# Patient Record
Sex: Female | Born: 1945
Health system: Southern US, Community
[De-identification: ages and names within clinical notes are randomized; demographics above are authoritative.]

## PROBLEM LIST (undated history)

## (undated) DIAGNOSIS — J45909 Unspecified asthma, uncomplicated: Secondary | ICD-10-CM

## (undated) DIAGNOSIS — K219 Gastro-esophageal reflux disease without esophagitis: Secondary | ICD-10-CM

## (undated) DIAGNOSIS — Z87442 Personal history of urinary calculi: Secondary | ICD-10-CM

## (undated) DIAGNOSIS — H3530 Unspecified macular degeneration: Secondary | ICD-10-CM

## (undated) DIAGNOSIS — M8580 Other specified disorders of bone density and structure, unspecified site: Secondary | ICD-10-CM

## (undated) DIAGNOSIS — E785 Hyperlipidemia, unspecified: Secondary | ICD-10-CM

## (undated) DIAGNOSIS — G4730 Sleep apnea, unspecified: Secondary | ICD-10-CM

## (undated) DIAGNOSIS — F419 Anxiety disorder, unspecified: Secondary | ICD-10-CM

## (undated) DIAGNOSIS — E079 Disorder of thyroid, unspecified: Secondary | ICD-10-CM

## (undated) DIAGNOSIS — I10 Essential (primary) hypertension: Secondary | ICD-10-CM

## (undated) HISTORY — DX: Other specified disorders of bone density and structure, unspecified site: M85.80

## (undated) HISTORY — PX: FOREARM SURGERY: SHX651

## (undated) HISTORY — DX: Gastro-esophageal reflux disease without esophagitis: K21.9

## (undated) HISTORY — DX: Essential (primary) hypertension: I10

## (undated) HISTORY — DX: Disorder of thyroid, unspecified: E07.9

## (undated) HISTORY — PX: APPENDECTOMY: SHX54

## (undated) HISTORY — DX: Hyperlipidemia, unspecified: E78.5

## (undated) HISTORY — DX: Anxiety disorder, unspecified: F41.9

## (undated) HISTORY — DX: Unspecified macular degeneration: H35.30

---

## 1998-03-05 ENCOUNTER — Emergency Department (HOSPITAL_COMMUNITY): Admission: EM | Admit: 1998-03-05 | Discharge: 1998-03-05 | Payer: Self-pay | Admitting: Emergency Medicine

## 1999-07-09 ENCOUNTER — Other Ambulatory Visit: Admission: RE | Admit: 1999-07-09 | Discharge: 1999-07-09 | Payer: Self-pay | Admitting: Gynecology

## 2000-05-14 ENCOUNTER — Encounter: Payer: Self-pay | Admitting: Internal Medicine

## 2000-05-14 ENCOUNTER — Encounter: Admission: RE | Admit: 2000-05-14 | Discharge: 2000-05-14 | Payer: Self-pay | Admitting: Family Medicine

## 2001-05-30 ENCOUNTER — Encounter: Admission: RE | Admit: 2001-05-30 | Discharge: 2001-05-30 | Payer: Self-pay | Admitting: Geriatric Medicine

## 2001-05-30 ENCOUNTER — Encounter: Payer: Self-pay | Admitting: Geriatric Medicine

## 2002-01-22 ENCOUNTER — Other Ambulatory Visit: Admission: RE | Admit: 2002-01-22 | Discharge: 2002-01-22 | Payer: Self-pay | Admitting: Gynecology

## 2003-01-12 ENCOUNTER — Inpatient Hospital Stay (HOSPITAL_COMMUNITY): Admission: EM | Admit: 2003-01-12 | Discharge: 2003-01-13 | Payer: Self-pay | Admitting: Emergency Medicine

## 2003-01-12 ENCOUNTER — Encounter: Payer: Self-pay | Admitting: Emergency Medicine

## 2003-06-03 ENCOUNTER — Other Ambulatory Visit: Admission: RE | Admit: 2003-06-03 | Discharge: 2003-06-03 | Payer: Self-pay | Admitting: Gynecology

## 2004-02-14 ENCOUNTER — Encounter: Admission: RE | Admit: 2004-02-14 | Discharge: 2004-02-14 | Payer: Self-pay | Admitting: Internal Medicine

## 2004-03-02 ENCOUNTER — Encounter: Admission: RE | Admit: 2004-03-02 | Discharge: 2004-05-31 | Payer: Self-pay | Admitting: Internal Medicine

## 2004-08-31 ENCOUNTER — Ambulatory Visit: Payer: Self-pay | Admitting: Internal Medicine

## 2004-09-11 ENCOUNTER — Ambulatory Visit: Payer: Self-pay | Admitting: Endocrinology

## 2004-09-15 ENCOUNTER — Ambulatory Visit: Payer: Self-pay | Admitting: Internal Medicine

## 2004-10-01 ENCOUNTER — Ambulatory Visit: Payer: Self-pay | Admitting: Internal Medicine

## 2004-10-22 ENCOUNTER — Other Ambulatory Visit: Admission: RE | Admit: 2004-10-22 | Discharge: 2004-10-22 | Payer: Self-pay | Admitting: Gynecology

## 2004-10-28 ENCOUNTER — Ambulatory Visit: Payer: Self-pay | Admitting: Internal Medicine

## 2005-05-28 ENCOUNTER — Ambulatory Visit: Payer: Self-pay | Admitting: Internal Medicine

## 2005-08-03 ENCOUNTER — Ambulatory Visit: Payer: Self-pay | Admitting: Internal Medicine

## 2005-08-09 ENCOUNTER — Ambulatory Visit: Payer: Self-pay | Admitting: Internal Medicine

## 2006-02-01 ENCOUNTER — Ambulatory Visit: Payer: Self-pay | Admitting: Internal Medicine

## 2006-05-10 ENCOUNTER — Ambulatory Visit: Payer: Self-pay | Admitting: Internal Medicine

## 2006-08-25 ENCOUNTER — Ambulatory Visit: Payer: Self-pay | Admitting: Internal Medicine

## 2006-09-06 ENCOUNTER — Ambulatory Visit: Payer: Self-pay | Admitting: Family Medicine

## 2007-01-10 ENCOUNTER — Ambulatory Visit: Payer: Self-pay | Admitting: Internal Medicine

## 2007-01-10 LAB — CONVERTED CEMR LAB
AST: 20 units/L (ref 0–37)
Albumin: 3.8 g/dL (ref 3.5–5.2)
Alkaline Phosphatase: 67 units/L (ref 39–117)
BUN: 11 mg/dL (ref 6–23)
Basophils Absolute: 0.1 10*3/uL (ref 0.0–0.1)
Bilirubin Urine: NEGATIVE
Chloride: 104 meq/L (ref 96–112)
Cholesterol: 210 mg/dL (ref 0–200)
Creatinine, Ser: 0.8 mg/dL (ref 0.4–1.2)
Direct LDL: 116.6 mg/dL
Eosinophils Absolute: 0.4 10*3/uL (ref 0.0–0.6)
GFR calc non Af Amer: 78 mL/min
HDL: 65.7 mg/dL (ref >39.0)
Hemoglobin, Urine: NEGATIVE
Ketones, ur: NEGATIVE mg/dL
MCHC: 33.6 g/dL (ref 30.0–36.0)
MCV: 82.7 fL (ref 78.0–100.0)
Monocytes Relative: 7.3 % (ref 3.0–11.0)
Potassium: 4.3 meq/L (ref 3.5–5.1)
RBC: 4.5 M/uL (ref 3.87–5.11)
RDW: 13.6 % (ref 11.5–14.6)
Sodium: 141 meq/L (ref 135–145)
Total Bilirubin: 0.7 mg/dL (ref 0.3–1.2)
Total CHOL/HDL Ratio: 3.2
Triglycerides: 155 mg/dL — ABNORMAL HIGH (ref 0–149)
Urine Glucose: NEGATIVE mg/dL
VLDL: 31 mg/dL (ref 0–40)
Vitamin B-12: 346 pg/mL (ref 211–911)
pH: 7 (ref 5.0–8.0)

## 2007-01-17 ENCOUNTER — Ambulatory Visit: Payer: Self-pay | Admitting: Internal Medicine

## 2007-05-19 DIAGNOSIS — E039 Hypothyroidism, unspecified: Secondary | ICD-10-CM | POA: Insufficient documentation

## 2007-05-19 DIAGNOSIS — E049 Nontoxic goiter, unspecified: Secondary | ICD-10-CM | POA: Insufficient documentation

## 2007-05-19 DIAGNOSIS — H409 Unspecified glaucoma: Secondary | ICD-10-CM | POA: Insufficient documentation

## 2007-05-19 DIAGNOSIS — G2581 Restless legs syndrome: Secondary | ICD-10-CM | POA: Insufficient documentation

## 2007-05-19 DIAGNOSIS — K219 Gastro-esophageal reflux disease without esophagitis: Secondary | ICD-10-CM | POA: Insufficient documentation

## 2007-05-19 DIAGNOSIS — J309 Allergic rhinitis, unspecified: Secondary | ICD-10-CM | POA: Insufficient documentation

## 2007-05-19 DIAGNOSIS — E538 Deficiency of other specified B group vitamins: Secondary | ICD-10-CM | POA: Insufficient documentation

## 2007-05-19 DIAGNOSIS — I10 Essential (primary) hypertension: Secondary | ICD-10-CM | POA: Insufficient documentation

## 2007-09-15 ENCOUNTER — Ambulatory Visit: Payer: Self-pay | Admitting: Internal Medicine

## 2007-09-15 DIAGNOSIS — K9430 Esophagostomy complications, unspecified: Secondary | ICD-10-CM

## 2007-09-15 DIAGNOSIS — K9433 Esophagostomy malfunction: Secondary | ICD-10-CM | POA: Insufficient documentation

## 2007-09-15 LAB — CONVERTED CEMR LAB
Alkaline Phosphatase: 69 units/L (ref 39–117)
BUN: 15 mg/dL (ref 6–23)
Basophils Absolute: 0.1 10*3/uL (ref 0.0–0.1)
Bilirubin, Direct: 0.1 mg/dL (ref 0.0–0.3)
CO2: 28 meq/L (ref 19–32)
Cholesterol: 231 mg/dL (ref 0–200)
Direct LDL: 129.1 mg/dL
Eosinophils Absolute: 0.4 10*3/uL (ref 0.0–0.6)
GFR calc Af Amer: 94 mL/min
GFR calc non Af Amer: 78 mL/min
HDL: 79.4 mg/dL (ref >39.0)
Ketones, ur: NEGATIVE mg/dL
Lymphocytes Relative: 26.9 % (ref 12.0–46.0)
MCHC: 33.5 g/dL (ref 30.0–36.0)
MCV: 82.9 fL (ref 78.0–100.0)
Monocytes Relative: 6.7 % (ref 3.0–11.0)
Mucus, UA: NEGATIVE
Neutro Abs: 3.5 10*3/uL (ref 1.4–7.7)
Platelets: 272 10*3/uL (ref 150–400)
Potassium: 3.9 meq/L (ref 3.5–5.1)
RBC: 4.56 M/uL (ref 3.87–5.11)
TSH: 5.59 microintl units/mL — ABNORMAL HIGH (ref 0.35–5.50)
Total Protein: 7.5 g/dL (ref 6.0–8.3)
Triglycerides: 107 mg/dL (ref 0–149)
Urine Glucose: NEGATIVE mg/dL
Urobilinogen, UA: 0.2 (ref 0.0–1.0)
Vit D, 1,25-Dihydroxy: 41 (ref 30–89)
WBC: 6 10*3/uL (ref 4.5–10.5)
pH: 6 (ref 5.0–8.0)

## 2007-09-19 ENCOUNTER — Ambulatory Visit: Payer: Self-pay | Admitting: Internal Medicine

## 2007-09-19 DIAGNOSIS — N3090 Cystitis, unspecified without hematuria: Secondary | ICD-10-CM | POA: Insufficient documentation

## 2007-10-03 ENCOUNTER — Ambulatory Visit: Payer: Self-pay | Admitting: Internal Medicine

## 2007-10-03 ENCOUNTER — Observation Stay (HOSPITAL_COMMUNITY): Admission: EM | Admit: 2007-10-03 | Discharge: 2007-10-04 | Payer: Self-pay | Admitting: Emergency Medicine

## 2007-10-20 ENCOUNTER — Telehealth: Payer: Self-pay | Admitting: Internal Medicine

## 2007-10-26 ENCOUNTER — Encounter: Payer: Self-pay | Admitting: Internal Medicine

## 2007-10-26 ENCOUNTER — Ambulatory Visit: Payer: Self-pay

## 2007-11-08 ENCOUNTER — Ambulatory Visit: Payer: Self-pay | Admitting: Internal Medicine

## 2007-11-08 DIAGNOSIS — R05 Cough: Secondary | ICD-10-CM

## 2007-11-08 DIAGNOSIS — R059 Cough, unspecified: Secondary | ICD-10-CM | POA: Insufficient documentation

## 2007-11-12 DIAGNOSIS — F411 Generalized anxiety disorder: Secondary | ICD-10-CM | POA: Insufficient documentation

## 2007-12-13 ENCOUNTER — Ambulatory Visit: Payer: Self-pay | Admitting: Internal Medicine

## 2007-12-20 LAB — CONVERTED CEMR LAB
ALT: 19 units/L (ref 0–35)
AST: 20 units/L (ref 0–37)
Albumin: 4.1 g/dL (ref 3.5–5.2)
Alkaline Phosphatase: 69 units/L (ref 39–117)
Crystals: NEGATIVE
HDL: 63.3 mg/dL (ref >39.0)
RBC / HPF: NONE SEEN
Specific Gravity, Urine: >=1.03 (ref 1.000–1.03)
Vitamin B-12: 840 pg/mL (ref 211–911)

## 2008-01-10 ENCOUNTER — Ambulatory Visit: Payer: Self-pay | Admitting: Internal Medicine

## 2008-01-10 DIAGNOSIS — J069 Acute upper respiratory infection, unspecified: Secondary | ICD-10-CM | POA: Insufficient documentation

## 2008-01-10 DIAGNOSIS — R5383 Other fatigue: Secondary | ICD-10-CM

## 2008-01-10 DIAGNOSIS — R5381 Other malaise: Secondary | ICD-10-CM | POA: Insufficient documentation

## 2008-02-16 ENCOUNTER — Ambulatory Visit: Payer: Self-pay | Admitting: Internal Medicine

## 2008-02-16 DIAGNOSIS — M25569 Pain in unspecified knee: Secondary | ICD-10-CM | POA: Insufficient documentation

## 2008-02-17 DIAGNOSIS — M1990 Unspecified osteoarthritis, unspecified site: Secondary | ICD-10-CM | POA: Insufficient documentation

## 2008-03-06 ENCOUNTER — Ambulatory Visit: Payer: Self-pay | Admitting: Internal Medicine

## 2008-03-06 ENCOUNTER — Observation Stay (HOSPITAL_COMMUNITY): Admission: EM | Admit: 2008-03-06 | Discharge: 2008-03-07 | Payer: Self-pay | Admitting: Emergency Medicine

## 2008-03-15 ENCOUNTER — Ambulatory Visit: Payer: Self-pay | Admitting: Internal Medicine

## 2008-03-15 DIAGNOSIS — R079 Chest pain, unspecified: Secondary | ICD-10-CM | POA: Insufficient documentation

## 2008-03-22 ENCOUNTER — Encounter: Admission: RE | Admit: 2008-03-22 | Discharge: 2008-03-22 | Payer: Self-pay | Admitting: Internal Medicine

## 2008-03-29 ENCOUNTER — Telehealth: Payer: Self-pay | Admitting: Internal Medicine

## 2009-02-21 ENCOUNTER — Ambulatory Visit: Payer: Self-pay | Admitting: Internal Medicine

## 2009-04-03 ENCOUNTER — Ambulatory Visit: Payer: Self-pay | Admitting: Internal Medicine

## 2009-05-05 ENCOUNTER — Encounter: Admission: RE | Admit: 2009-05-05 | Discharge: 2009-05-05 | Payer: Self-pay | Admitting: Internal Medicine

## 2009-10-23 ENCOUNTER — Ambulatory Visit: Payer: Self-pay | Admitting: Internal Medicine

## 2009-12-03 ENCOUNTER — Encounter (INDEPENDENT_AMBULATORY_CARE_PROVIDER_SITE_OTHER): Payer: Self-pay | Admitting: *Deleted

## 2010-01-06 ENCOUNTER — Encounter (INDEPENDENT_AMBULATORY_CARE_PROVIDER_SITE_OTHER): Payer: Self-pay | Admitting: *Deleted

## 2010-01-07 ENCOUNTER — Ambulatory Visit: Payer: Self-pay | Admitting: Internal Medicine

## 2010-01-16 ENCOUNTER — Telehealth: Payer: Self-pay | Admitting: Internal Medicine

## 2010-02-02 ENCOUNTER — Encounter (INDEPENDENT_AMBULATORY_CARE_PROVIDER_SITE_OTHER): Payer: Self-pay | Admitting: *Deleted

## 2010-03-05 ENCOUNTER — Encounter (INDEPENDENT_AMBULATORY_CARE_PROVIDER_SITE_OTHER): Payer: Self-pay | Admitting: *Deleted

## 2010-03-09 ENCOUNTER — Ambulatory Visit: Payer: Self-pay | Admitting: Internal Medicine

## 2010-03-17 ENCOUNTER — Ambulatory Visit: Payer: Self-pay | Admitting: Internal Medicine

## 2010-03-18 ENCOUNTER — Encounter: Payer: Self-pay | Admitting: Internal Medicine

## 2010-03-24 ENCOUNTER — Ambulatory Visit: Payer: Self-pay | Admitting: Internal Medicine

## 2010-09-03 ENCOUNTER — Ambulatory Visit: Payer: Self-pay | Admitting: Internal Medicine

## 2010-09-18 ENCOUNTER — Ambulatory Visit: Payer: Self-pay | Admitting: Internal Medicine

## 2010-09-25 ENCOUNTER — Ambulatory Visit: Payer: Self-pay | Admitting: Internal Medicine

## 2010-10-02 ENCOUNTER — Ambulatory Visit: Payer: Self-pay | Admitting: Internal Medicine

## 2010-11-13 ENCOUNTER — Ambulatory Visit
Admission: RE | Admit: 2010-11-13 | Discharge: 2010-11-13 | Payer: Self-pay | Source: Home / Self Care | Attending: Internal Medicine | Admitting: Internal Medicine

## 2010-11-19 NOTE — Letter (Signed)
Summary: Patient Notice- Polyp Results   Gastroenterology  [address] [place], [place] [address]   Phone: [phone]  Fax: [phone]        March 18, 2010 MRN: [id number]    [facility] [address] [place], [place]  [address]    Dear Ms. Andrey Campanile,  I am pleased to inform you that the colon polyp(s) removed during your recent colonoscopy was (were) found to be benign (no cancer detected) upon pathologic examination.  I recommend you have a repeat colonoscopy examination in 10 years to look for recurrent polyps, as having colon polyps increases your risk for having recurrent polyps or even colon cancer in the future.  Should you develop new or worsening symptoms of abdominal pain, bowel habit changes or bleeding from the rectum or bowels, please schedule an evaluation with either your primary care physician or with me.  Additional information/recommendations:  __ No further action with gastroenterology is needed at this time. Please      follow-up with your primary care physician for your other healthcare      needs.   Please call us if you are having persistent problems or have questions about your condition that have not been fully answered at this time.  Sincerely,  Hilarie Fredrickson MD  This letter has been electronically signed by your physician.  Appended Document: Patient Notice- Polyp Results letter mailed.

## 2010-11-19 NOTE — Letter (Signed)
Summary: Previsit letter  [facility]  [address] [place], [place] [address]   Phone: [phone]  Fax: [phone]       12/03/2009 MRN: [id number]  [facility] [address] [place], [place]  [address]  Dear Ms. Andrey Campanile,  Welcome to the Gastroenterology Division at [facility].    You are scheduled to see a nurse for your pre-procedure visit on 01-07-10 at 10:30AM on the 3rd floor at [facility], [address]  We ask that you try to arrive at our office 15 minutes prior to your appointment time to allow for check-in.  Your nurse visit will consist of discussing your medical and surgical history, your immediate family medical history, and your medications.    Please bring a complete list of all your medications or, if you prefer, bring the medication bottles and we will list them.  We will need to be aware of both prescribed and over the counter drugs.  We will need to know exact dosage information as well.  If you are on blood thinners (Coumadin, Plavix, Aggrenox, Ticlid, etc.) please call our office today/prior to your appointment, as we need to consult with your physician about holding your medication.   Please be prepared to read and sign documents such as consent forms, a financial agreement, and acknowledgement forms.  If necessary, and with your consent, a friend or relative is welcome to sit-in on the nurse visit with you.  Please bring your insurance card so that we may make a copy of it.  If your insurance requires a referral to see a specialist, please bring your referral form from your primary care physician.  No co-pay is required for this nurse visit.     If you cannot keep your appointment, please call [phone] to cancel or reschedule prior to your appointment date.  This allows [place] the opportunity to schedule an appointment for another patient in need of care.    Thank you for choosing [facility] for your medical  needs.  We appreciate the opportunity to care for you.  Please visit [place] at our website  to learn more about our practice.                     Sincerely.                                                                                                                   The Gastroenterology Division

## 2010-11-19 NOTE — Letter (Signed)
Summary: Moviprep Instructions  [facility]  [address]   [place], [place] [address]   Phone: [phone]  Fax: [phone]       [name]    1946-04-13    MRN: [id number]        Procedure Day /Date: Tuesday, 03-17-10     Arrival Time: 10:00 a.m.      Procedure Time: 11:00 a.m.     Location of Procedure:                    x   [facility] (4th Floor)                        PREPARATION FOR COLONOSCOPY WITH MOVIPREP   Starting 5 days prior to your procedure 03-12-10  do not eat nuts, seeds, popcorn, corn, beans, peas,  salads, or any raw vegetables.  Do not take any fiber supplements (e.g. Metamucil, Citrucel, and Benefiber).  THE DAY BEFORE YOUR PROCEDURE         DATE: 03-16-10   DAY: Monday  1.  Drink clear liquids the entire day-NO SOLID FOOD  2.  Do not drink anything colored red or purple.  Avoid juices with pulp.  No orange juice.  3.  Drink at least 64 oz. (8 glasses) of fluid/clear liquids during the day to prevent dehydration and help the prep work efficiently.  CLEAR LIQUIDS INCLUDE: Water Jello Ice Popsicles Tea (sugar ok, no milk/cream) Powdered fruit flavored drinks Coffee (sugar ok, no milk/cream) Gatorade Juice: apple, white grape, white cranberry  Lemonade Clear bullion, consomm, broth Carbonated beverages (any kind) Strained chicken noodle soup Hard Candy                             4.  In the morning, mix first dose of MoviPrep solution:    Empty 1 Pouch A and 1 Pouch B into the disposable container    Add lukewarm drinking water to the top line of the container. Mix to dissolve    Refrigerate (mixed solution should be used within 24 hrs)  5.  Begin drinking the prep at 5:00 p.m. The MoviPrep container is divided by 4 marks.   Every 15 minutes drink the solution down to the next mark (approximately 8 oz) until the full liter is complete.   6.  Follow completed prep with 16 oz of clear liquid of your choice  (Nothing red or purple).  Continue to drink clear liquids until bedtime.  7.  Before going to bed, mix second dose of MoviPrep solution:    Empty 1 Pouch A and 1 Pouch B into the disposable container    Add lukewarm drinking water to the top line of the container. Mix to dissolve    Refrigerate  THE DAY OF YOUR PROCEDURE      DATE: 03-17-10   DAY: Tuesday  Beginning at 6:00 a.m. (5 hours before procedure):         1. Every 15 minutes, drink the solution down to the next mark (approx 8 oz) until the full liter is complete.  2. Follow completed prep with 16 oz. of clear liquid of your choice.    3. You may drink clear liquids until 9:00 a.m.  (2 HOURS BEFORE PROCEDURE).   MEDICATION INSTRUCTIONS  Unless otherwise instructed, you should take regular prescription medications with a small sip of water   as  early as possible the morning of your procedure.   Additional medication instructions:  n/a         OTHER INSTRUCTIONS  You will need a responsible adult at least 64 years of age to accompany you and drive you home.   This person must remain in the waiting room during your procedure.  Wear loose fitting clothing that is easily removed.  Leave jewelry and other valuables at home.  However, you may wish to bring a book to read or  an iPod/MP3 player to listen to music as you wait for your procedure to start.  Remove all body piercing jewelry and leave at home.  Total time from sign-in until discharge is approximately 2-3 hours.  You should go home directly after your procedure and rest.  You can resume normal activities the  day after your procedure.  The day of your procedure you should not:   Drive   Make legal decisions   Operate machinery   Drink alcohol   Return to work  You will receive specific instructions about eating, activities and medications before you leave.    The above instructions have been reviewed and explained to me by   Sherren Kerns RN   Mar 09, 2010 10:15 AM    I fully understand and can verbalize these instructions _____________________________ Date _________

## 2010-11-19 NOTE — Miscellaneous (Signed)
Summary: previsit/rm  Clinical Lists Changes  Medications: Added new medication of MOVIPREP 100 GM  SOLR (PEG-KCL-NACL-NASULF-NA ASC-C) As per prep instructions. - Signed Rx of MOVIPREP 100 GM  SOLR (PEG-KCL-NACL-NASULF-NA ASC-C) As per prep instructions.;  #1 x 0;  Signed;  Entered by: Sherren Kerns RN;  Authorized by: Hilarie Fredrickson MD;  Method used: Electronically to [facility] [address] [place], [place], [place]  [address], Ph: [phone], Fax: [phone] Observations: Added new observation of ALLERGY REV: Done (03/09/2010 9:54)    Prescriptions: MOVIPREP 100 GM  SOLR (PEG-KCL-NACL-NASULF-NA ASC-C) As per prep instructions.  #1 x 0   Entered by:   Sherren Kerns RN   Authorized by:   Hilarie Fredrickson MD   Signed by:   Sherren Kerns RN on 03/09/2010   Method used:   Electronically to        [facility]* (retail)       [address]       [place], [place]  [address]       Ph: [phone]       Fax: [phone]   RxID:   [id number]

## 2010-11-19 NOTE — Procedures (Signed)
Summary: Colonoscopy  Patient: [name] Note: All result statuses are Final unless otherwise noted.  Tests: (1) Colonoscopy (COL)   COL Colonoscopy           DONE     [facility]     [address]     [place], [place]  [address]           COLONOSCOPY PROCEDURE REPORT           PATIENT:  [name], [name]  MR#:  [id number]     BIRTHDATE:  Mar 16, 1946, 63 yrs. old  GENDER:  female     ENDOSCOPIST:  Wilhemina Bonito. Eda Keys, MD     REF. BY:  Screening (Recall)     PROCEDURE DATE:  03/17/2010     PROCEDURE:  Colonoscopy with snare polypectomy x 1     ASA CLASS:  Class II     INDICATIONS:  Routine Risk Screening ; index exam 08-2003 was     negative     MEDICATIONS:   Fentanyl 100 mcg IV, Versed 10 mg IV           DESCRIPTION OF PROCEDURE:   After the risks benefits and     alternatives of the procedure were thoroughly explained, informed     consent was obtained.  Digital rectal exam was performed and     revealed no abnormalities.   The LB CF-[id number] endoscope     was introduced through the anus and advanced to the cecum, which     was identified by both the appendix and ileocecal valve, without     limitations.Time to cecum = 3:12 min.  The quality of the prep was     excellent, using MoviPrep.  The instrument was then slowly     withdrawn (time = 12:43 min) as the colon was fully examined.     <<PROCEDUREIMAGES>>           FINDINGS:  A diminutive polyp was found in the descending colon.     Polyp was snared without cautery. Retrieval was successful. snare     polyp  Mild diverticulosis was found in the sigmoid colon.     Retroflexed views in the rectum revealed no abnormalities.    The     scope was then withdrawn from the patient and the procedure     completed.           COMPLICATIONS:  None     ENDOSCOPIC IMPRESSION:     1) Diminutive polyp in the descending colon - removed     2) Mild diverticulosis in the sigmoid colon           RECOMMENDATIONS:     1)  Repeat colonoscopy in 5 years if polyp adenomatous; otherwise     10 years           ______________________________     Wilhemina Bonito. Eda Keys, MD           CC:  Sharlet Salina, MD; The Patient           n.     eSIGNED:   Wilhemina Bonito. Eda Keys at 03/17/2010 12:13 PM           Buena Irish, [id number]  Note: An exclamation mark (!) indicates a result that was not dispersed into the flowsheet. Document Creation Date: 03/17/2010 12:14 PM _______________________________________________________________________  (1) Order result status: Final Collection or observation date-time: 03/17/2010 12:08 Requested date-time:  Receipt date-time:  Reported date-time:  Referring Physician:   Ordering Physician: Fransico Setters [phone]) Specimen Source:  Source: Launa Grill Order Number: [id number] Lab site:   Appended Document: Colonoscopy     Procedures Next Due Date:    Colonoscopy: 03/2020

## 2010-11-19 NOTE — Miscellaneous (Signed)
Summary: [facility] Previsit/prep  Clinical Lists Changes  Medications: Added new medication of MOVIPREP 100 GM  SOLR (PEG-KCL-NACL-NASULF-NA ASC-C) As per prep instructions. - Signed Rx of MOVIPREP 100 GM  SOLR (PEG-KCL-NACL-NASULF-NA ASC-C) As per prep instructions.;  #1 x 0;  Signed;  Entered by: Wyona Almas RN;  Authorized by: Hilarie Fredrickson MD;  Method used: Electronically to [facility] [address], [place], [place], [place]  [address], Ph: [phone], Fax: [phone] Observations: Added new observation of ALLERGY REV: Done (01/07/2010 10:18)    Prescriptions: MOVIPREP 100 GM  SOLR (PEG-KCL-NACL-NASULF-NA ASC-C) As per prep instructions.  #1 x 0   Entered by:   Wyona Almas RN   Authorized by:   Hilarie Fredrickson MD   Signed by:   Wyona Almas RN on 01/07/2010   Method used:   Electronically to        [facility] (retail)       [address]       [place], [place]  [address]       Ph: [phone]       Fax: [phone]   RxID:   [id number]

## 2010-11-19 NOTE — Letter (Signed)
Summary: Previsit letter  [facility]  [address] [place], [place] [address]   Phone: [phone]  Fax: [phone]       02/02/2010 MRN: [id number]  [facility] [address] [place], [place]  [address]  Dear Ms. Andrey Campanile,  Welcome to the Gastroenterology Division at [facility].    You are scheduled to see a nurse for your pre-procedure visit on 03-09-10 at 10:00a.m. on the 3rd floor at [facility], [address]  We ask that you try to arrive at our office 15 minutes prior to your appointment time to allow for check-in.  Your nurse visit will consist of discussing your medical and surgical history, your immediate family medical history, and your medications.    Please bring a complete list of all your medications or, if you prefer, bring the medication bottles and we will list them.  We will need to be aware of both prescribed and over the counter drugs.  We will need to know exact dosage information as well.  If you are on blood thinners (Coumadin, Plavix, Aggrenox, Ticlid, etc.) please call our office today/prior to your appointment, as we need to consult with your physician about holding your medication.   Please be prepared to read and sign documents such as consent forms, a financial agreement, and acknowledgement forms.  If necessary, and with your consent, a friend or relative is welcome to sit-in on the nurse visit with you.  Please bring your insurance card so that we may make a copy of it.  If your insurance requires a referral to see a specialist, please bring your referral form from your primary care physician.  No co-pay is required for this nurse visit.     If you cannot keep your appointment, please call [phone] to cancel or reschedule prior to your appointment date.  This allows [place] the opportunity to schedule an appointment for another patient in need of care.    Thank you for choosing [facility] for your medical  needs.  We appreciate the opportunity to care for you.  Please visit [place] at our website  to learn more about our practice.                     Sincerely.                                                                                                                   The Gastroenterology Division

## 2010-11-19 NOTE — Progress Notes (Signed)
Summary: rescheduled appt needs new instr.  Phone Note Call from Patient Call back at Home Phone [phone]   Caller: Patient Call For: Marina Goodell Reason for Call: Talk to Nurse Summary of Call: Patient wants to speak to nurse regarding prep instructions because she changed her procedure time. Initial call taken by: Tawni Levy,  January 16, 2010 9:53 AM  Follow-up for Phone Call        Spoke with pt. Reviewed new  prep instructions with her and answered all her questions. Pt will call back if she has further questions.Ulis Rias RN  January 16, 2010 10:51 AM

## 2010-11-19 NOTE — Letter (Signed)
Summary: [facility] Instructions  [facility]  [address] [place], [place] [address]   Phone: [phone]  Fax: [phone]       [name]    [birth year]    MRN: [id number]        Procedure Day Dorna Bloom:  [facility]  01/21/10     Arrival Time:  8:00AM      Procedure Time:  9:00AM     Location of Procedure:                    Juliann Pares _  [facility] (4th Floor)                        PREPARATION FOR COLONOSCOPY WITH MOVIPREP   Starting 5 days prior to your procedure 01/16/10 do not eat nuts, seeds, popcorn, corn, beans, peas,  salads, or any raw vegetables.  Do not take any fiber supplements (e.g. Metamucil, Citrucel, and Benefiber).  THE DAY BEFORE YOUR PROCEDURE         DATE: 01/20/10  DAY: TUESDAY  1.  Drink clear liquids the entire day-NO SOLID FOOD  2.  Do not drink anything colored red or purple.  Avoid juices with pulp.  No orange juice.  3.  Drink at least 64 oz. (8 glasses) of fluid/clear liquids during the day to prevent dehydration and help the prep work efficiently.  CLEAR LIQUIDS INCLUDE: Water Jello Ice Popsicles Tea (sugar ok, no milk/cream) Powdered fruit flavored drinks Coffee (sugar ok, no milk/cream) Gatorade Juice: apple, white grape, white cranberry  Lemonade Clear bullion, consomm, broth Carbonated beverages (any kind) Strained chicken noodle soup Hard Candy                             4.  In the morning, mix first dose of MoviPrep solution:    Empty 1 Pouch A and 1 Pouch B into the disposable container    Add lukewarm drinking water to the top line of the container. Mix to dissolve    Refrigerate (mixed solution should be used within 24 hrs)  5.  Begin drinking the prep at 5:00 p.m. The MoviPrep container is divided by 4 marks.   Every 15 minutes drink the solution down to the next mark (approximately 8 oz) until the full liter is complete.   6.  Follow completed prep with 16 oz of clear liquid of your choice  (Nothing red or purple).  Continue to drink clear liquids until bedtime.  7.  Before going to bed, mix second dose of MoviPrep solution:    Empty 1 Pouch A and 1 Pouch B into the disposable container    Add lukewarm drinking water to the top line of the container. Mix to dissolve    Refrigerate  THE DAY OF YOUR PROCEDURE      DATE:  01/21/10  DAY:  WEDNESDAY  Beginning at 4:00a.m. (5 hours before procedure):         1. Every 15 minutes, drink the solution down to the next mark (approx 8 oz) until the full liter is complete.  2. Follow completed prep with 16 oz. of clear liquid of your choice.    3. You may drink clear liquids until 7:00AM (2 HOURS BEFORE PROCEDURE).   MEDICATION INSTRUCTIONS  Unless otherwise instructed, you should take regular prescription medications with a small sip of water   as early as  possible the morning of your procedure.   Additional medication instructions: Hold Benicar/HCT the morning of procedure.         OTHER INSTRUCTIONS  You will need a responsible adult at least 64 years of age to accompany you and drive you home.   This person must remain in the waiting room during your procedure.  Wear loose fitting clothing that is easily removed.  Leave jewelry and other valuables at home.  However, you may wish to bring a book to read or  an iPod/MP3 player to listen to music as you wait for your procedure to start.  Remove all body piercing jewelry and leave at home.  Total time from sign-in until discharge is approximately 2-3 hours.  You should go home directly after your procedure and rest.  You can resume normal activities the  day after your procedure.  The day of your procedure you should not:   Drive   Make legal decisions   Operate machinery   Drink alcohol   Return to work  You will receive specific instructions about eating, activities and medications before you leave.    The above instructions have been reviewed and  explained to me by   Wyona Almas RN  January 07, 2010 11:10 AM     I fully understand and can verbalize these instructions _____________________________ Date _________

## 2011-03-02 NOTE — H&P (Signed)
NAMEFLOYE, FESLER               ACCOUNT NO.:  [id number]   MEDICAL RECORD NO.:  [id number]          PATIENT TYPE:  EMS   LOCATION:  MAJO                         FACILITY:  [facility]   PHYSICIAN:  Hollice Espy, M.D.DATE OF BIRTH:  24-Aug-1946   DATE OF ADMISSION:  10/03/2007  DATE OF DISCHARGE:                              HISTORY & PHYSICAL   PRIMARY CARE PHYSICIAN:  Georgina Quint. Plotnikov, MD   CHIEF COMPLAINT:  Shortness of breath and jaw pain.   HISTORY OF PRESENT ILLNESS:  The patient is a 65 year old white female  with past medical history of hypertension, hyperlipidemia, and  hypothyroidism, who has been previously well and then today while she  [place], all of a sudden started feeling very  lightheaded.  This lasted for about 10 minutes and so she decided to go  home.  On the drive home she started feeling short of breath noticeably,  although she noted she did not have any wheezing or coughing.  She did  not have any episode of chest pain, but she did have some episodes of  jaw pain.  She became concerned, went to Urgent Care and was referred  here.  On route to the hospital, she was given nitroglycerin and she  said that eased off her jaw pain completely, as did her lightheadedness.  Her shortness of breath has persisted and is almost back to normal, but  she still says she can still feel when she takes a deep breath that she  feels some mild difficulty.  On admission she was noted to have a blood  pressure of 157/60.  She was satting 100% on 2 liters.  Labs were done  on the patient and she was found to have essentially normal with the  exception of creatinine of 1.2, although a CBC was not done.  Cardiac  markers were done.  Her EKG showed occasional PVCs and otherwise an  essentially normal sinus rhythm.  No evidence of any ST changes.  Although the patient was complaining of shortness of breath for some  reason, a chest x-ray was not ordered.   Currently she is doing okay.  She denies any headaches, vision changes, dysphagia.  Her jaw pain has  completely resolved.  No palpitations.  She complains of some mild  shortness of breath even at rest, but no wheezing or coughing, no  abdominal pain.  No hematuria, dysuria or constipation, diarrhea, focal  extremity numbness, weakness, or pain.   REVIEW OF SYSTEMS:  Otherwise negative.   PAST MEDICAL HISTORY:  Includes hyperlipidemia, hypertension,  depression, hypothyroidism, and B12 deficiency.   MEDICATIONS:  She is on Benicar, Synthroid, Zoloft, vitamin B12, and  vitamin D.   ALLERGIES:  SHE HAS ALLERGIES TO PENICILLIN AND SULFA.   SOCIAL HISTORY:  She denies any tobacco, alcohol, or drug use.   FAMILY HISTORY:  Noted for a dad who had CAD with MI at age 47.   PHYSICAL EXAMINATION:  VITAL SIGNS:  On admission temperature 97.1.  Heart rate 65.  Blood pressure 157/60.  Respirations 18.  O2  sat 100% on  2 liters.  GENERAL:  In general she is alert and oriented x3, in no apparent  distress.  HEENT:  Normocephalic, atraumatic.  Her mucous membranes are moist.  She  has no carotid bruits.  HEART:  Regular rate and rhythm.  S1 and S2.  LUNGS:  Clear to auscultation bilaterally.  ABDOMEN:  Soft, nontender, nondistended.  Positive bowel sounds.  EXTREMITIES:  No clubbing, cyanosis, or edema.   LAB WORK:  EKG as per HPI.  I have ordered a chest x-ray.  Sodium 141,  potassium 4.7, chloride 109, bicarb 28, BUN 16.  Creatinine 1.2.  Glucose 105.  CPK 78.6, MB 1.6, troponin less than 0.05.   ASSESSMENT AND PLAN:  1. Shortness of breath with unusual jaw pain and lightheadedness.  She      certainly has risk factors for cardiovascular disease.  Will check      a chest x-ray; if it is unremarkable, check 2 more sets of cardiac      markers.  Place on telemetry.  If these are normal, then will defer      to the [facility] hospitalist.  The patient may be able to be      discharged to home  with an outpatient stress test plan.  2. Hypertension.  3. Hypothyroidism.  4. Depression, stable.      Hollice Espy, M.D.  Electronically Signed     SKK/MEDQ  D:  10/03/2007  T:  10/03/2007  Job:  [id number]   cc:   Georgina Quint. Plotnikov, MD

## 2011-03-02 NOTE — Discharge Summary (Signed)
**Note Kimberly via Obfuscation** NAMEBRINLEE, Robles               ACCOUNT NO.:  [id number]   MEDICAL RECORD NO.:  [id number]          PATIENT TYPE:  INP   LOCATION:  4728                         FACILITY:  [facility]   PHYSICIAN:  Valerie A. Felicity Coyer, MDDATE OF BIRTH:  1946-07-31   DATE OF ADMISSION:  10/03/2007  DATE OF DISCHARGE:  10/05/2007                               DISCHARGE SUMMARY   DISCHARGE DIAGNOSES:  1. Atypical chest pain,resolved.  Cardiac enzymes negative.  Plan for      outpatient stress test.  2. Hypertension.  3. Dyslipidemia.  4. B12 deficiency.  5. Restless legs syndrome.  6. Gastroesophageal reflux disease.  7. Allergic rhinitis.   HISTORY OF PRESENT ILLNESS:  [name] is a 65 year old female who  presented on October 03, 2007,with chief complaint of shortness of  breath and jaw pain.  She apparently was at a [employer] when  all of a sudden she started feeling very lightheaded.  This lasted  approximately 10 minutes.  She decided to go home.  On the drive home  she started feeling slightly short of breath.  She noted some episodes  of jaw pain and presented to Urgent Care and then was referred to [facility] ER.  She was admitted to complete serial cardiac enzymes.   PAST MEDICAL HISTORY:  1. Hyperlipidemia.  2. Hypertension.  3. Depression.  4. Hypothyroidism.  5. B12 this deficiency.   COURSE OF HOSPITALIZATION:  ATYPICAL CHEST PAIN, RESOLVED:  The patient was admitted and underwent  serial cardiac enzymes, which were negative x3.  A D-dimer was drawn,  which was within normal limits.  She was afebrile.  The patient's  symptoms have resolved.   MEDICATIONS AT TIME OF DISCHARGE:  1. Benicar/HCT 40/12.5 mg p.o. daily.  2. Synthroid 125 mcg p.o. daily.  3. Zoloft 50 mg p.o. daily.  4. Vitamin B12 1000 mcg p.o. daily.  5. Vitamin D3 1000 international unit daily.  6. Calcium plus D 500 mg p.o. daily.  7. Prilosec OTC 1 tablet p.o. one to two times daily for indigestion      x2 weeks for a trial.   FOLLOW-UP:  The patient is instructed to follow up with Georgina Quint.  Plotnikov, MD, in 1-2 weeks and contact the office for an appointment.  She is also scheduled for a stress test at [facility] on  December 31 at 9 a.m.  She is instructed to return to the ER should she  develop recurrent shortness of breath or chest pain.      Sandford Craze, NP      Raenette Rover. Felicity Coyer, MD  Electronically Signed    MO/MEDQ  D:  10/04/2007  T:  10/05/2007  Job:  [id number]   cc:   Georgina Quint. Plotnikov, MD

## 2011-03-02 NOTE — Discharge Summary (Signed)
[name], [name]               ACCOUNT NO.:  [id number]   MEDICAL RECORD NO.:  [id number]          PATIENT TYPE:  INP   LOCATION:  3712                         FACILITY:  [facility]   PHYSICIAN:  Valerie A. Felicity Coyer, MDDATE OF BIRTH:  July 11, 1946   DATE OF ADMISSION:  03/06/2008  DATE OF DISCHARGE:  03/07/2008                               DISCHARGE SUMMARY   DISCHARGE DIAGNOSES:  1. Atypical chest pain.  2. Gastroesophageal reflux disease.  3. Hypothyroidism.  4. Hypertension.  5. Dyslipidemia.  6. Restless leg syndrome.  7. History of B12 deficiency.  8. Allergic rhinitis.  9. History of kidney stones.   HISTORY OF PRESENT ILLNESS:  [name] is a 65 year old white female  who was admitted on Mar 06, 2008, with chief complaint of chest pain.  She noted that the chest pain woke her up on the morning of admission.  It was across her entire chest.  It was sharp and lasted approximately  15 minutes or so.  The patient then started to have some shortness of  breath and nausea but denied any vomiting.  She felt slightly  lightheaded and presented to the emergency department for further  evaluation and treatment.   COURSE OF HOSPITALIZATION:  Atypical chest pain.  The patient was  admitted and underwent serial cardiac enzymes which were negative.  EKG  noted sinus bradycardia, otherwise normal.  She was noted to have an  elevated D-dimer and CT angio of the chest was pursued which was  negative for pulmonary embolus and noting only minimal scattered areas  of atelectasis.  She reports having a stress test at [facility]  in December 2008 which was reportedly negative.  We will request a copy  of this report.  Her total cholesterol performed this admission was 185  with an HDL 55 and LDL of 113.  She was advised to continue a heart-  healthy diet.  We will continue the baby aspirin at time of discharge.   MEDICATIONS AT THE TIME OF DISCHARGE:  1. Benicar 40/12.5 one tablet  p.o. daily.  2. Synthroid 125 mcg p.o. daily.  3. Vitamin B complex once daily as before.  4. Vitamin D once daily as before.  5. Prilosec 40 mg p.o. b.i.d.  6. Aspirin 81 mg p.o. daily.  7. Carafate 1 g p.o. t.i.d. as needed for heartburn.   PERTINENT LABORATORY DATA:  At the time of discharge, cardiac enzymes  negative x3.  Urine culture is pending.  Hemoglobin A1c 5.5, TSH 2.262,  hemoglobin 11.8, and hematocrit 33.8, BUN 12, creatinine 0.77, D-dimer  0.55, BNP 171.   DISPOSITION:  The patient will be discharged to home.   FOLLOWUP:  She is instructed to follow up with Dr. Macarthur Critchley Plotnikov in  1-2 weeks.      Sandford Craze, NP      Raenette Rover. Felicity Coyer, MD  Electronically Signed    MO/MEDQ  D:  03/07/2008  T:  03/07/2008  Job:  [id number]

## 2011-03-02 NOTE — H&P (Signed)
[name], [name]               ACCOUNT NO.:  [id number]   MEDICAL RECORD NO.:  [id number]          PATIENT TYPE:  INP   LOCATION:  1824                         FACILITY:  [facility]   PHYSICIAN:  Michiel Cowboy, MDDATE OF BIRTH:  1946/04/04   DATE OF ADMISSION:  03/06/2008  DATE OF DISCHARGE:                              HISTORY & PHYSICAL   PRIMARY CARE [name]:  Georgina Quint. Plotnikov, MD   CHIEF COMPLAINT:  Chest pain.   The patient is a 65 year old female with past medical history of  hypertension, dyslipidemia, and GERD, who presents with chest pain that  woke her up this morning.  It was across the entire chest, sharp-like,  lasted about 15 minutes or so.  Then, the patient started to have  shortness of breath, nausea, no vomiting, but just dry heaves, kind of  slightly lightheaded, and presented to the emergency department.  Thereafter, her symptoms quickly resolved.  [facility] called to  admit the patient for [place].   REVIEW OF SYSTEMS:  As per HPI, otherwise negative.   PAST MEDICAL HISTORY:  1. History of atypical chest pain, for which the patient was supposed      to undergo a cardiac stress test, the results of which are not      available to me.  2. Hypertension.  3. Dyslipidemia.  4. B12 deficiency.  5. Restless leg syndrome.  6. GERD.  7. Allergic rhinitis.  8. Hypothyroidism.  9. History of kidney stones.   SOCIAL HISTORY:  The patient is not a smoker.  Does not use drugs.  Does  not abuse alcohol.  Lives at home with her husband.   FAMILY HISTORY:  Significant for father with coronary artery disease at  the age of 26.  No early heart attacks or deaths in the family.   PHYSICAL EXAMINATION:  VITAL SIGNS:  Temperature not obtained,  respirations 22, heart rate 61, blood pressure 119/48, O2 saturation not  obtained.  GENERAL:  The patient is in no acute distress, lying down on the  stretcher, surprised at being admitted.  HEENT:  Head  atraumatic.  Moist mucous membranes.  HEART:  Regular rate and rhythm.  No murmurs, rubs, or gallops.  LOWER EXTREMITIES:  Without edema.  LUNGS:  Clear to auscultation bilaterally.  ABDOMEN:  Soft, nontender, nondistended.  NEUROLOGIC:  Intact.   LABORATORIES:  Hemoglobin 12.2.  Sodium 139, potassium 4.5, creatinine  1.  Cardiac enzymes:  Our point of care markers negative.  Chest x-ray  showed no cardiopulmonary disease.  EKG showed no change from prior.  No  ischemic changes.  Sinus rhythm, heart rate of 68.   ASSESSMENT AND PLAN:  1. Chest pain.  This is a 65 year old female with slightly atypical      chest pain but extensive risk factors.  Will admit for coronary      artery disease workup and acute coronary syndrome rule out.  Will      obtain cardiac markers x3 and repeat ECG on arrival to floor.  Will      obtain fasting lipid panel, TSH, hemoglobin  A1c, BNP, and D-dimer      for further workup of her chest pain.  Chest could be      gastrointestinal in origin, as the patient does have extensive      history of GERD.  Will put on Protonix twice a day since the      patient has already been on extensive PPIs at home, and give      Carafate as needed.  Will also start the patient on aspirin 81 mg      p.o. daily.  2. Gastroesophageal reflux disease.  As per above, Protonix plus      Carafate p.r.n.  3. Hypothyroidism.  Continue Synthroid.  Check TSH.  4. Hypertension.  Continue Benicar.  Hold hydrochlorothiazide, as the      patient has been recently vomiting and could be dehydrated.  5. Mild dehydration.  Will check orthostatics and give IV fluids at 75      an hour for 10 hours.  6. Prophylaxis.  Lovenox with Protonix.      Michiel Cowboy, MD  Electronically Signed     AVD/MEDQ  D:  03/06/2008  T:  03/06/2008  Job:  [id number]   cc:   Georgina Quint. Plotnikov, MD

## 2011-03-05 NOTE — Assessment & Plan Note (Signed)
[facility]                                 ON-CALL NOTE   ALYSABETH, SCALIA                        MRN:          [id number]  DATE:03/28/2008                            DOB:          01/04/1946    Phone number [phone].   PRIMARY CARE PHYSICIAN:  Georgina Quint. Plotnikov, MD   SUBJECTIVE:  Yesterday she had some hesitancy of urinary flow and mild  pain.  Today she has continued hesitancy, no pain, but has started  passing some pink urine that looks like blood in her urine.  No clots,  no fever, no back pain.   ASSESSMENT/PLAN:  Possible urinary tract infection.  No severe symptoms  that suggest she needs to see a physician today.  Recommend appointment  with primary care doctor in the morning.  If symptoms become worse in  next few hours, she may be seen at urgent care before tomorrow.     Kerby Nora, MD  Electronically Signed    AB/MedQ  DD: 03/28/2008  DT: 03/28/2008  Job #: [id number]

## 2011-03-05 NOTE — Assessment & Plan Note (Signed)
[facility]                                 ON-CALL NOTE   [name], [name]                        MRN:          [id number]  DATE:07/02/2007                            DOB:          Nov 26, 1945    PHONE NUMBER:  [phone]   Dr. Loren Racer patient.   The patient may have a urinary tract infection.  She does have a history  of kidney stones and a couple of days ago had some blood in her urine  and some urgency.  She drinks cranberry juice and lots of fluids and it  improved.  However, today it has returned without associated fever or  significant back pain.  She does not really think it is consistent with  her kidney stone.  I have recommended that she be evaluated at Urgent  Care for appropriate evaluation and treatment for a probable UTI plus or  minus stone.     Neta Mends. Panosh, MD  Electronically Signed    WKP/MedQ  DD: 07/02/2007  DT: 07/02/2007  Job #: [id number]

## 2011-03-05 NOTE — Discharge Summary (Signed)
   NAME:  [name], [name]                         ACCOUNT NO.:  [id number]   MEDICAL RECORD NO.:  [id number]                   PATIENT TYPE:  INP   LOCATION:  5506                                 FACILITY:  [facility]   PHYSICIAN:  Georgina Quint. Plotnikov, M.D. [facility]      DATE OF BIRTH:  Feb 03, 1946   DATE OF ADMISSION:  01/11/2003  DATE OF DISCHARGE:  01/13/2003                                 DISCHARGE SUMMARY   FINAL DIAGNOSES:  1. Atypical chest pain, resolved, myocardial infarction ruled out.     Pulmonary embolism ruled out.  2. Uncontrolled hypertension.  3. Palpitations and weakness related to Toprol therapy discontinued.  4. Hypothyroidism, under treated.  5. Gastroesophageal reflux disease.  6. Bradycardia from Toprol, resolved.   DISCHARGE MEDICATIONS:  1. Benicar 40 mg daily.  2. ________ 4 mg 1/2 daily.  3. Synthroid 100 mcg daily.  4. Resume other home medicines as before except for Toprol.   DIET:  Low salt diet.   DISCHARGE INSTRUCTIONS:  Stop Toprol.   FOLLOW UP:  Georgina Quint. Plotnikov, M.D. [facility] in 7 to 10 days.   HISTORY OF PRESENT ILLNESS:  The patient is a 65 year old female who  presents with chest pain, lightheadedness, and palpitations on January 11, 2003.  For further details, please refer to my history and physical from  January 11, 2003.   HOSPITAL COURSE:  During the course of hospitalization, she was observed on  telemetry unit. She remained hypertensive. Her heart rate dropped down to 30  beats per minute.  On the day of discharge, she is feeling well.  Her heart  rate was 72, blood pressure 186/78, afebrile.  HEENT with moist mucosa.  Lungs clear, no wheezes or rales.  Heart regular S2 and S2 with no murmur  and no gallop. Abdomen soft and nontender.  Without edema.  She was alert,  oriented, and cooperative.   LABORATORY DATA:  EKG with sinus bradycardia.  TSH 7.14.  CMET normal.  CBC  with hemoglobin 11.9, MCV 85.6, troponin normal. CK x3 normal.  Blood  gas  was normal.                                               Aleksei V. Plotnikov, M.D. [facility]    AVP/MEDQ  D:  01/13/2003  T:  01/14/2003  Job:  [id number]

## 2011-03-05 NOTE — H&P (Signed)
NAME:  [name], [name]                         ACCOUNT NO.:  [id number]   MEDICAL RECORD NO.:  [id number]                   PATIENT TYPE:  EMS   LOCATION:  MINO                                 FACILITY:  [facility]   PHYSICIAN:  Georgina Quint. Plotnikov, M.D. [facility]      DATE OF BIRTH:  October 09, 1946   DATE OF ADMISSION:  01/11/2003  DATE OF DISCHARGE:                                HISTORY & PHYSICAL   CHIEF COMPLAINT:  Chest pain and shortness of breath.   HISTORY OF PRESENT ILLNESS:  The patient is a 65 year old female who started  to have a racing sensation in the heart around 3:30 p.m. and a rush  sensation in the head when she wound bend over.  Later developed right sided  chest pain with some shortness of breath and the need to take a deep sigh.  No syncope, no light headedness, no previous symptoms.  She presented first  to Urgent Care and was sent to the ER.   PAST MEDICAL HISTORY:  1. Hypertension.  2. GERD.  3. Hypothyroidism.   CURRENT MEDICATIONS:  Toprol 50 mg a day, Celexa 40 mg daily, Synthroid 75  mcg daily, Allegra 60 mg daily, Fem heart one daily, Aciphex 20 mg daily.   ALLERGIES:  Sulfa and penicillin.   SOCIAL HISTORY:  She is a nonsmoker.   FAMILY HISTORY:  Father had a myocardial infarction.   REVIEW OF SYSTEMS:  No exertional symptoms, no syncope, no lightheadedness,  occasions problems with digestion.  Some minor leg swelling today at ankles, no calf pain.  The rest is  negative.   PHYSICAL EXAMINATION:  VITAL SIGNS: Blood pressure 163/84, pulse 68,  respirations 20, saturations 97% on room air.  Temperature is 97.8.  GENERAL: She is in no acute distress.  HEENT: Moist mucosa.  NECK: Supple, no thyromegaly or bruit.  LUNGS: Clear to auscultation and percussion, no wheezes or rales.  HEART: S1, S2 no murmur, no enlargement to percussion.  ABDOMEN: Soft, nontender, no organomegaly, no mass is felt.  EXTREMITIES: Without edema.  Calves nontender.  NEUROLOGIC:  Cranial nerves II-XII normal.  Deep tendon reflexes normal. She  is alert and oriented and cooperative.  Denies being depressed at present.  SKIN: Clear.   LABS:  EKG with sinus bradycardia.   ASSESSMENT AND PLAN:  1. Chest pain, atypical, rule out pulmonary embolus, obtain CT scan in view     of her dyspnea. Wanting to rule out myocardial infarction, obtain CK x3,     troponin x3, q.8h.  2. Hypertension.  Add Benicar 40 mg daily.  3. Palpitations, resolved.  4. Hypothyroidism, continue current therapy, check TSH.  5. Gastroesophageal reflux disease, continue Aciphex 20 mg daily.  Georgina Quint. Plotnikov, M.D. [facility]    AVP/MEDQ  D:  01/12/2003  T:  01/13/2003  Job:  [id number]

## 2011-05-10 ENCOUNTER — Encounter: Payer: Self-pay | Admitting: Internal Medicine

## 2011-05-11 ENCOUNTER — Ambulatory Visit (INDEPENDENT_AMBULATORY_CARE_PROVIDER_SITE_OTHER): Payer: 59 | Admitting: Internal Medicine

## 2011-05-11 ENCOUNTER — Encounter: Payer: Self-pay | Admitting: Internal Medicine

## 2011-05-11 DIAGNOSIS — I10 Essential (primary) hypertension: Secondary | ICD-10-CM

## 2011-05-11 DIAGNOSIS — E039 Hypothyroidism, unspecified: Secondary | ICD-10-CM

## 2011-05-11 DIAGNOSIS — E785 Hyperlipidemia, unspecified: Secondary | ICD-10-CM

## 2011-05-11 DIAGNOSIS — R319 Hematuria, unspecified: Secondary | ICD-10-CM

## 2011-05-11 DIAGNOSIS — N390 Urinary tract infection, site not specified: Secondary | ICD-10-CM

## 2011-05-11 LAB — POCT URINALYSIS DIPSTICK
Ketones, UA: NEGATIVE
Protein, UA: NEGATIVE
Spec Grav, UA: 1.015

## 2011-05-11 LAB — LIPID PANEL
Cholesterol: 210 mg/dL — ABNORMAL HIGH (ref 0–200)
HDL: 67 mg/dL (ref >39)
LDL Cholesterol: 117 mg/dL — ABNORMAL HIGH (ref 0–99)
Total CHOL/HDL Ratio: 3.1 Ratio
Triglycerides: 129 mg/dL (ref <150)
VLDL: 26 mg/dL (ref 0–40)

## 2011-05-11 LAB — TSH: TSH: 3.021 u[IU]/mL (ref 0.350–4.500)

## 2011-05-11 NOTE — Progress Notes (Signed)
  Subjective:    Patient ID: [name], female    DOB: October 05, 1946, 65 y.o.   MRN: [id number]  HPI  pleasant 65 year old white female with multiple medical problems including hypertension, hypothyroidism, GE reflux, hyperlipidemia for six-month recheck. Fasting lipid panel drawn today. Is not on any medication for hyperlipidemia. In January total cholesterol was 245 and previously had been within normal limits in June 2011. Patient previously weighed 191 pounds January 2012 and has lost 14 pounds over 6 months. A couple of days ago had frank hematuria. Has had some issues with dysuria for the past week or so. No fever shaking chills nausea or vomiting. Blood pressure today initially 152/76. Rechecked was 140/80. She is a bit anxious.    Review of Systems     Objective:   Physical Exam no CVA tenderness; chest clear; cardiac exam regular rate and rhythm normal S1 and S2; extremities without edema; no thyromegaly        Assessment & Plan:  Hyperlipidemia-currently not on statin therapy- fasting lipid panel pending-14 pound weight loss past 6 months  Hypothyroidism-TSH drawn today on thyroid replacement medication  Urinary tract infection frank hematuria noted a couple of days ago by patient. Urinalysis today done and culture is pending treat with Macrobid 100 mg twice daily for 7 days. Recheck in 2 weeks.  Hypertension-recheck in 2 weeks

## 2011-05-11 NOTE — Patient Instructions (Signed)
Take Macrobid 100 mg twice daily for 7 days return in 2 weeks for blood pressure check and urine specimen check. Will advise you by letter as to results of lab work

## 2011-05-12 ENCOUNTER — Encounter: Payer: Self-pay | Admitting: Internal Medicine

## 2011-05-14 LAB — URINE CULTURE

## 2011-05-25 ENCOUNTER — Encounter: Payer: Self-pay | Admitting: Internal Medicine

## 2011-05-25 ENCOUNTER — Ambulatory Visit (INDEPENDENT_AMBULATORY_CARE_PROVIDER_SITE_OTHER): Payer: 59 | Admitting: Internal Medicine

## 2011-05-25 VITALS — BP 116/66 | HR 60 | Temp 97.5°F | Ht 67.0 in | Wt 178.0 lb

## 2011-05-25 DIAGNOSIS — N390 Urinary tract infection, site not specified: Secondary | ICD-10-CM

## 2011-05-25 DIAGNOSIS — Z0000 Encounter for general adult medical examination without abnormal findings: Secondary | ICD-10-CM

## 2011-05-25 DIAGNOSIS — I10 Essential (primary) hypertension: Secondary | ICD-10-CM

## 2011-05-25 DIAGNOSIS — Z23 Encounter for immunization: Secondary | ICD-10-CM

## 2011-05-25 LAB — POCT URINALYSIS DIPSTICK
Bilirubin, UA: NEGATIVE
Leukocytes, UA: NEGATIVE
Nitrite, UA: NEGATIVE
Protein, UA: NEGATIVE
pH, UA: 5

## 2011-05-25 NOTE — Progress Notes (Signed)
  Subjective:    Patient ID: [name], female    DOB: 09-26-1946, 65 y.o.   MRN: [id number]  HPI  in today to followup on a recent urinary tract infection treated with Macrodantin. Patient is now asymptomatic. Urinalysis is clear. Blood pressure is within normal limits. Zostavax given at her request.    Review of Systems     Objective:   Physical Exam chest clear, cardiac exam regular rate and rhythm, extremities without edema        Assessment & Plan:  Urinary tract infection  Hypertension  Health maintenance: Zostavax vaccine administered  Return in 4-6 months

## 2011-06-15 ENCOUNTER — Other Ambulatory Visit: Payer: Self-pay | Admitting: *Deleted

## 2011-06-15 MED ORDER — OLMESARTAN MEDOXOMIL-HCTZ 40-25 MG PO TABS: 1.0000 | ORAL_TABLET | Freq: Every day | ORAL | Status: DC

## 2011-06-16 ENCOUNTER — Other Ambulatory Visit: Payer: Self-pay

## 2011-06-16 MED ORDER — OLMESARTAN MEDOXOMIL-HCTZ 40-25 MG PO TABS: 1.0000 | ORAL_TABLET | Freq: Every day | ORAL | Status: DC

## 2011-07-14 LAB — POCT I-STAT, CHEM 8
Calcium, Ion: 1.15
Chloride: 105
HCT: 36
Hemoglobin: 12.2
TCO2: 27

## 2011-07-14 LAB — PROTIME-INR: Prothrombin Time: 12.1

## 2011-07-14 LAB — COMPREHENSIVE METABOLIC PANEL
ALT: 19
Albumin: 3.5
Alkaline Phosphatase: 75
BUN: 12
Chloride: 107
Glucose, Bld: 104 — ABNORMAL HIGH
Potassium: 4.2
Total Bilirubin: 0.7

## 2011-07-14 LAB — CARDIAC PANEL(CRET KIN+CKTOT+MB+TROPI)
CK, MB: 1.3
Relative Index: 1.5
Total CK: 107
Troponin I: 0.01
Troponin I: 0.01

## 2011-07-14 LAB — TSH: TSH: 2.262

## 2011-07-14 LAB — URINALYSIS, ROUTINE W REFLEX MICROSCOPIC
Bilirubin Urine: NEGATIVE
Ketones, ur: NEGATIVE
Nitrite: NEGATIVE
Protein, ur: NEGATIVE
pH: 7

## 2011-07-14 LAB — CBC
HCT: 33.8 — ABNORMAL LOW
Hemoglobin: 11.8 — ABNORMAL LOW
RBC: 4.14
WBC: 4.8

## 2011-07-14 LAB — HEMOGLOBIN A1C: Hgb A1c MFr Bld: 5.5

## 2011-07-14 LAB — LIPID PANEL
HDL: 55
Total CHOL/HDL Ratio: 3.4
Triglycerides: 85
VLDL: 17

## 2011-07-14 LAB — D-DIMER, QUANTITATIVE: D-Dimer, Quant: 0.55 — ABNORMAL HIGH

## 2011-07-14 LAB — URINE CULTURE: Colony Count: >=100000

## 2011-07-14 LAB — B-NATRIURETIC PEPTIDE (CONVERTED LAB): Pro B Natriuretic peptide (BNP): 171 — ABNORMAL HIGH

## 2011-07-14 LAB — POCT CARDIAC MARKERS: Operator id: 277751

## 2011-07-23 LAB — POCT CARDIAC MARKERS
Myoglobin, poc: 78.6
Operator id: 196461
Troponin i, poc: <0.05

## 2011-07-23 LAB — CARDIAC PANEL(CRET KIN+CKTOT+MB+TROPI)
CK, MB: 1.3
CK, MB: 1.5
Relative Index: 1.3
Total CK: 109
Troponin I: 0.01

## 2011-07-23 LAB — I-STAT 8, (EC8 V) (CONVERTED LAB)
Acid-Base Excess: 2
Bicarbonate: 27.7 — ABNORMAL HIGH
HCT: 34 — ABNORMAL LOW
Operator id: 196461
Sodium: 141
pCO2, Ven: 47.1

## 2011-11-30 ENCOUNTER — Ambulatory Visit: Payer: 59 | Admitting: Internal Medicine

## 2011-12-09 ENCOUNTER — Ambulatory Visit (INDEPENDENT_AMBULATORY_CARE_PROVIDER_SITE_OTHER): Payer: 59 | Admitting: Internal Medicine

## 2011-12-09 ENCOUNTER — Encounter: Payer: Self-pay | Admitting: Internal Medicine

## 2011-12-09 DIAGNOSIS — Z23 Encounter for immunization: Secondary | ICD-10-CM

## 2011-12-09 DIAGNOSIS — M7750 Other enthesopathy of unspecified foot: Secondary | ICD-10-CM

## 2011-12-09 DIAGNOSIS — E039 Hypothyroidism, unspecified: Secondary | ICD-10-CM

## 2011-12-09 DIAGNOSIS — M7740 Metatarsalgia, unspecified foot: Secondary | ICD-10-CM

## 2011-12-09 DIAGNOSIS — K219 Gastro-esophageal reflux disease without esophagitis: Secondary | ICD-10-CM

## 2011-12-09 MED ORDER — RANITIDINE HCL 150 MG PO TABS: 300.0000 mg | ORAL_TABLET | Freq: Every day | ORAL | Status: DC

## 2011-12-09 NOTE — Patient Instructions (Addendum)
Continue same medications. Return in 6 months 

## 2011-12-09 NOTE — Progress Notes (Signed)
  Subjective:    Patient ID: [name], female    DOB: [birth year], 66 y.o.   MRN: [id number]  HPI In today for six-month followup and other health maintenance issues. Had colonoscopy 03/17/2010 by Dr. Yancey Flemings at [facility]. Needs to see GYN physician this year. She last saw GYN physician Dr. Greta Doom 2011. Did not have mammogram last year. History of hypothyroidism. TSH drawn today. Has had some issues with pain metatarsal area plantar aspect left foot. Previously had issues with plantar fasciitis which resolved after 2 years. Dr. Lucie Leather gave her ranitidine 300 mg daily and she's asking for refill for that given #90 with when necessary one year refills.    Review of Systems     Objective:   Physical Exam neck supple without thyromegaly or adenopathy; chest clear; cardiac exam regular rate and rhythm; extremities without edema        Assessment & Plan:  Hypothyroidism  Metatarsalgia  GE reflux  Plan:Tdap vaccine given today. Return September 2013 for physical exam. Had Zostavax immunization at last visit in August 2012. Will be due for Pneumovax at age 18.

## 2011-12-10 LAB — TSH: TSH: 2.922 u[IU]/mL (ref 0.350–4.500)

## 2011-12-20 ENCOUNTER — Telehealth: Payer: Self-pay | Admitting: Internal Medicine

## 2011-12-20 MED ORDER — RANITIDINE HCL 300 MG PO CAPS: 300.0000 mg | ORAL_CAPSULE | Freq: Every evening | ORAL | Status: DC

## 2011-12-20 MED ORDER — RANITIDINE HCL 150 MG PO TABS: 300.0000 mg | ORAL_TABLET | Freq: Every day | ORAL | Status: DC

## 2011-12-20 NOTE — Telephone Encounter (Signed)
Ranitidine rewritten for 300mg  dose instead of 150

## 2011-12-21 ENCOUNTER — Other Ambulatory Visit: Payer: Self-pay

## 2011-12-21 MED ORDER — LEVOTHYROXINE SODIUM 125 MCG PO TABS: 125.0000 ug | ORAL_TABLET | Freq: Every day | ORAL | Status: AC

## 2012-02-21 ENCOUNTER — Other Ambulatory Visit: Payer: Self-pay

## 2012-06-22 ENCOUNTER — Encounter: Payer: 59 | Admitting: Internal Medicine

## 2012-06-26 ENCOUNTER — Emergency Department (HOSPITAL_COMMUNITY)
Admission: EM | Admit: 2012-06-26 | Discharge: 2012-06-26 | Disposition: A | Discharge status: Home / Self Care | Payer: Medicare Other | Attending: Emergency Medicine | Admitting: Emergency Medicine

## 2012-06-26 ENCOUNTER — Encounter (HOSPITAL_COMMUNITY): Payer: Self-pay | Admitting: Family Medicine

## 2012-06-26 ENCOUNTER — Emergency Department (HOSPITAL_COMMUNITY): Payer: Medicare Other

## 2012-06-26 DIAGNOSIS — E785 Hyperlipidemia, unspecified: Secondary | ICD-10-CM | POA: Insufficient documentation

## 2012-06-26 DIAGNOSIS — Z9089 Acquired absence of other organs: Secondary | ICD-10-CM | POA: Insufficient documentation

## 2012-06-26 DIAGNOSIS — E079 Disorder of thyroid, unspecified: Secondary | ICD-10-CM | POA: Insufficient documentation

## 2012-06-26 DIAGNOSIS — Z79899 Other long term (current) drug therapy: Secondary | ICD-10-CM | POA: Insufficient documentation

## 2012-06-26 DIAGNOSIS — K219 Gastro-esophageal reflux disease without esophagitis: Secondary | ICD-10-CM | POA: Insufficient documentation

## 2012-06-26 DIAGNOSIS — I10 Essential (primary) hypertension: Secondary | ICD-10-CM | POA: Insufficient documentation

## 2012-06-26 DIAGNOSIS — R0602 Shortness of breath: Secondary | ICD-10-CM | POA: Insufficient documentation

## 2012-06-26 DIAGNOSIS — R002 Palpitations: Secondary | ICD-10-CM | POA: Insufficient documentation

## 2012-06-26 LAB — COMPREHENSIVE METABOLIC PANEL
ALT: 15 U/L (ref 0–35)
Albumin: 4.2 g/dL (ref 3.5–5.2)
Alkaline Phosphatase: 75 U/L (ref 39–117)
BUN: 12 mg/dL (ref 6–23)
Chloride: 102 mEq/L (ref 96–112)
GFR calc Af Amer: >90 mL/min (ref >90)
Glucose, Bld: 104 mg/dL — ABNORMAL HIGH (ref 70–99)
Potassium: 4.4 mEq/L (ref 3.5–5.1)
Sodium: 138 mEq/L (ref 135–145)
Total Bilirubin: 0.3 mg/dL (ref 0.3–1.2)
Total Protein: 8 g/dL (ref 6.0–8.3)

## 2012-06-26 LAB — CBC WITH DIFFERENTIAL/PLATELET
Eosinophils Absolute: 0.1 10*3/uL (ref 0.0–0.7)
Hemoglobin: 12.8 g/dL (ref 12.0–15.0)
Lymphs Abs: 1.4 10*3/uL (ref 0.7–4.0)
Monocytes Relative: 5 % (ref 3–12)
Neutro Abs: 3.8 10*3/uL (ref 1.7–7.7)
Neutrophils Relative %: 68 % (ref 43–77)
Platelets: 255 10*3/uL (ref 150–400)
RBC: 4.73 MIL/uL (ref 3.87–5.11)
WBC: 5.6 10*3/uL (ref 4.0–10.5)

## 2012-06-26 LAB — POCT I-STAT TROPONIN I: Troponin i, poc: 0 ng/mL (ref 0.00–0.08)

## 2012-06-26 NOTE — ED Provider Notes (Signed)
History     CSN: [id number]  Arrival date & time 06/26/12  1055   First MD Initiated Contact with Patient 06/26/12 1342      Chief Complaint  Patient presents with  . Palpitations    (Consider location/radiation/quality/duration/timing/severity/associated sxs/prior treatment) Patient is a 66 y.o. female presenting with palpitations. The history is provided by the patient, medical records and a significant other. No language interpreter was used.  Palpitations  This is a new problem. The current episode started 6 to 12 hours ago. The problem occurs rarely. The problem has been resolved. The problem is associated with an unknown factor. Associated symptoms include shortness of breath. Pertinent negatives include no diaphoresis, no chest pain and no chest pressure. She has tried nothing for the symptoms. Risk factors include post menopause. Her past medical history does not include anemia, heart disease or valve disorder.    Past Medical History  Diagnosis Date  . Hypertension   . Thyroid disease   . GERD (gastroesophageal reflux disease)   . Hyperlipidemia   . Glaucoma   . Osteopenia   . Anxiety   . Macular degeneration     Past Surgical History  Procedure Date  . Cesarean section   . Appendectomy     Family History  Problem Relation Age of Onset  . Heart disease Mother   . Hypertension Mother   . Heart disease Father   . Stroke Father   . Hypertension Father     History  Substance Use Topics  . Smoking status: Never Smoker   . Smokeless tobacco: Never Used  . Alcohol Use: No    OB History    Grav Para Term Preterm Abortions TAB SAB Ect Mult Living                  Review of Systems  Constitutional: Negative for diaphoresis.  Respiratory: Positive for shortness of breath.   Cardiovascular: Positive for palpitations. Negative for chest pain.  All other systems reviewed and are negative.    Allergies  Penicillins; Ropinirole hydrochloride; and  Sulfacetamide sodium  Home Medications   Current Outpatient Rx  Name Route Sig Dispense Refill  . AMLODIPINE BESYLATE 10 MG PO TABS Oral Take 10 mg by mouth daily.      . B COMPLEX-C PO TABS Oral Take 1 tablet by mouth daily.      Marland Kitchen VITAMIN D 1000 UNITS PO CAPS Oral Take 1,000 Units by mouth daily.      . DEXLANSOPRAZOLE 60 MG PO CPDR Oral Take 60 mg by mouth daily.      . OMEGA-3 FATTY ACIDS 1000 MG PO CAPS Oral Take 2 g by mouth daily.      Marland Kitchen LANSOPRAZOLE 30 MG PO CPDR Oral Take 30 mg by mouth daily.      Marland Kitchen LATANOPROST 0.005 % OP SOLN  1 drop at bedtime.      Marland Kitchen LEVOTHYROXINE SODIUM 125 MCG PO TABS Oral Take 1 tablet (125 mcg total) by mouth daily. 90 tablet 1    Dispense as written.  . MOMETASONE FUROATE 50 MCG/ACT NA SUSP Nasal Place 2 sprays into the nose daily.      Marland Kitchen OLMESARTAN MEDOXOMIL-HCTZ 40-25 MG PO TABS Oral Take 1 tablet by mouth daily. 90 tablet 3  . RANITIDINE HCL 300 MG PO CAPS Oral Take 1 capsule (300 mg total) by mouth every evening. 90 capsule 3    BP 146/77  Pulse 54  Temp 98 F (36.7 C) (  Oral)  Resp 15  SpO2 96%  Physical Exam  Constitutional: She is oriented to person, place, and time. She appears well-developed and well-nourished.  HENT:  Head: Normocephalic and atraumatic.  Right Ear: External ear normal.  Left Ear: External ear normal.  Mouth/Throat: Oropharynx is clear and moist.  Eyes: Conjunctivae are normal. Pupils are equal, round, and reactive to light.  Neck: Normal range of motion. Neck supple.  Cardiovascular: Normal rate, regular rhythm, normal heart sounds and intact distal pulses.  Exam reveals no gallop and no friction rub.   No murmur heard. Pulmonary/Chest: Effort normal and breath sounds normal. No respiratory distress. She has no wheezes. She has no rales. She exhibits no tenderness.  Abdominal: Soft. Bowel sounds are normal. She exhibits no distension and no mass. There is no tenderness. There is no rebound and no guarding.    Musculoskeletal: Normal range of motion. She exhibits no edema and no tenderness.  Neurological: She is alert and oriented to person, place, and time. No cranial nerve deficit. Coordination normal.  Skin: Skin is warm and dry.  Psychiatric: She has a normal mood and affect.    ED Course  Procedures (including critical care time)  Labs Reviewed  COMPREHENSIVE METABOLIC PANEL - Abnormal; Notable for the following:    Glucose, Bld 104 (*)     GFR calc non Af Amer 87 (*)     All other components within normal limits  CBC WITH DIFFERENTIAL  POCT I-STAT TROPONIN I  MAGNESIUM  PHOSPHORUS  TSH   Dg Chest 2 View  06/26/2012  *RADIOLOGY REPORT*  Clinical Data: Palpitations  CHEST - 2 VIEW  Comparison: 03/06/2008  Findings: Heart size is normal.  Mediastinal shadows are normal. The vascularity is normal.  Lungs are clear except for minimal scarring at the apices.  No effusions.  No bony abnormalities.  IMPRESSION: No active disease.   Original Report Authenticated By: Thomasenia Sales, M.D.      Date: 06/26/2012  Rate: 61   Rhythm: normal sinus rhythm  QRS Axis: normal  Intervals: normal  ST/T Wave abnormalities: normal  Conduction Disutrbances:none  Narrative Interpretation:   Old EKG Reviewed: unchanged     1. Palpitations   2. Shortness of breath       MDM   Patient is a 66 year old female who presents with one episode of palpitations and shortness of breath.  Event occurred at 6 AM and lasted 45 minutes.  No symptoms since then. Upon arrival in the emergency department patient noted to be afebrile and vital signs unremarkable. On exam patient with normal heart/lung sounds, no peripheral edema, no JVD, and no evidence of a goiter.  Due to concern for possible underlying causes of palpitations CXR and labs obtained.  EKG showed NSR and unchanged from prior.  Review of results showed no acute abnormality.  With symptoms resolved, unremarkable VS, and normal labs/imaging patient felt  to be stable for discharge home with PCP follow up.  She is to see PCP asap and decision for cardiology follow up will be decided.          Kimberly Ou, MD 06/27/12 [phone]

## 2012-06-26 NOTE — ED Notes (Signed)
Pt sts she woke up this am and heart was racing. sts currently it is not. Denies chest pain. sts some SOB.

## 2012-06-26 NOTE — ED Notes (Signed)
Pt d/c home in NAD. Pt voiced understanding of d/c instructions and follow up care.  

## 2012-06-26 NOTE — ED Notes (Signed)
Pt states that around 0600 today she felt as if her heart was racing. Pt states it lasted around 1hr and went away. Pt states that it has happened 1 other time in past and was placed on beta blocker, however HR went to 30 so it was D/cd. Pt states she was SOB and currently "I have to take a deep breath every now and then." Pt denies any other associated sx. Denies CP.

## 2012-06-27 LAB — TSH: TSH: 5.012 u[IU]/mL — ABNORMAL HIGH (ref 0.350–4.500)

## 2012-06-30 NOTE — ED Provider Notes (Signed)
I saw and evaluated the patient, reviewed the resident's note and I agree with the findings and plan.  Patient seen by me EKG without acute changes, work up negative, can be discharge home with close pcp and cardiology follow up.   Shelda Jakes, MD 06/30/12 [phone]

## 2013-11-29 ENCOUNTER — Other Ambulatory Visit: Payer: Self-pay | Admitting: Gynecology

## 2014-06-14 ENCOUNTER — Encounter: Payer: Self-pay | Admitting: Internal Medicine

## 2015-09-18 ENCOUNTER — Encounter: Payer: Self-pay | Admitting: Internal Medicine

## 2016-01-30 ENCOUNTER — Encounter (HOSPITAL_COMMUNITY): Payer: Self-pay

## 2016-01-30 ENCOUNTER — Emergency Department (HOSPITAL_COMMUNITY)
Admission: EM | Admit: 2016-01-30 | Discharge: 2016-01-31 | Disposition: A | Discharge status: Home / Self Care | Payer: Medicare HMO | Attending: Emergency Medicine | Admitting: Emergency Medicine

## 2016-01-30 DIAGNOSIS — Z880 Allergy status to penicillin: Secondary | ICD-10-CM | POA: Insufficient documentation

## 2016-01-30 DIAGNOSIS — Z7982 Long term (current) use of aspirin: Secondary | ICD-10-CM | POA: Diagnosis not present

## 2016-01-30 DIAGNOSIS — E079 Disorder of thyroid, unspecified: Secondary | ICD-10-CM | POA: Diagnosis not present

## 2016-01-30 DIAGNOSIS — S52591A Other fractures of lower end of right radius, initial encounter for closed fracture: Secondary | ICD-10-CM | POA: Diagnosis not present

## 2016-01-30 DIAGNOSIS — S6991XA Unspecified injury of right wrist, hand and finger(s), initial encounter: Secondary | ICD-10-CM | POA: Diagnosis present

## 2016-01-30 DIAGNOSIS — Y9389 Activity, other specified: Secondary | ICD-10-CM | POA: Diagnosis not present

## 2016-01-30 DIAGNOSIS — S61214A Laceration without foreign body of right ring finger without damage to nail, initial encounter: Secondary | ICD-10-CM | POA: Diagnosis not present

## 2016-01-30 DIAGNOSIS — K219 Gastro-esophageal reflux disease without esophagitis: Secondary | ICD-10-CM | POA: Diagnosis not present

## 2016-01-30 DIAGNOSIS — Z9889 Other specified postprocedural states: Secondary | ICD-10-CM

## 2016-01-30 DIAGNOSIS — Y9289 Other specified places as the place of occurrence of the external cause: Secondary | ICD-10-CM | POA: Insufficient documentation

## 2016-01-30 DIAGNOSIS — S8262XA Displaced fracture of lateral malleolus of left fibula, initial encounter for closed fracture: Secondary | ICD-10-CM | POA: Diagnosis not present

## 2016-01-30 DIAGNOSIS — S52501A Unspecified fracture of the lower end of right radius, initial encounter for closed fracture: Secondary | ICD-10-CM

## 2016-01-30 DIAGNOSIS — F419 Anxiety disorder, unspecified: Secondary | ICD-10-CM | POA: Diagnosis not present

## 2016-01-30 DIAGNOSIS — S61212A Laceration without foreign body of right middle finger without damage to nail, initial encounter: Secondary | ICD-10-CM | POA: Insufficient documentation

## 2016-01-30 DIAGNOSIS — Z79899 Other long term (current) drug therapy: Secondary | ICD-10-CM | POA: Insufficient documentation

## 2016-01-30 DIAGNOSIS — S0081XA Abrasion of other part of head, initial encounter: Secondary | ICD-10-CM | POA: Insufficient documentation

## 2016-01-30 DIAGNOSIS — W01198A Fall on same level from slipping, tripping and stumbling with subsequent striking against other object, initial encounter: Secondary | ICD-10-CM | POA: Insufficient documentation

## 2016-01-30 DIAGNOSIS — Z23 Encounter for immunization: Secondary | ICD-10-CM | POA: Diagnosis not present

## 2016-01-30 DIAGNOSIS — IMO0001 Reserved for inherently not codable concepts without codable children: Secondary | ICD-10-CM

## 2016-01-30 DIAGNOSIS — Y998 Other external cause status: Secondary | ICD-10-CM | POA: Insufficient documentation

## 2016-01-30 DIAGNOSIS — I10 Essential (primary) hypertension: Secondary | ICD-10-CM | POA: Insufficient documentation

## 2016-01-30 DIAGNOSIS — E785 Hyperlipidemia, unspecified: Secondary | ICD-10-CM | POA: Insufficient documentation

## 2016-01-30 NOTE — ED Notes (Signed)
Pt arrived via [facility] and tripped [facility] curb and fell.  Right wrist deformity-splinted, left ankle pain, right rib pain, left thumb pain, right cheek laceration.  Pain 4/10.  Ring stuck on right ring finger, swollen.

## 2016-01-30 NOTE — ED Provider Notes (Signed)
CSN: [id number]     Arrival date & time 01/30/16  2351 History  By signing my name below, I, Freida Busman, attest that this documentation has been prepared under the direction and in the presence of Tomasita Crumble, MD . Electronically Signed: Freida Busman, Scribe. 01/31/2016. 12:04 AM.     Chief Complaint  Patient presents with  . Fall    The history is provided by the patient. No language interpreter was used.    HPI Comments:  [name] is a 70 y.o. female who presents to the Emergency Department s/p fall tonight complaining of 4/10 constant right wrist pain following the incident. Pt states she fell getting out of her truck and landed on asphalt. She was told by husband that she lost consciousness for a second. She reports associated pain to her left ankle and laceration to her right cheek. She denies use of anti-coagulants other than daily 81 mg ASA. No alleviating factors noted.  Past Medical History  Diagnosis Date  . Hypertension   . Thyroid disease   . GERD (gastroesophageal reflux disease)   . Hyperlipidemia   . Glaucoma   . Osteopenia   . Anxiety   . Macular degeneration    Past Surgical History  Procedure Laterality Date  . Cesarean section    . Appendectomy     Family History  Problem Relation Age of Onset  . Heart disease Mother   . Hypertension Mother   . Heart disease Father   . Stroke Father   . Hypertension Father    Social History  Substance Use Topics  . Smoking status: Never Smoker   . Smokeless tobacco: Never Used  . Alcohol Use: No   OB History    No data available     Review of Systems  10 systems reviewed and all are negative for acute change except as noted in the HPI.  Allergies  Ropinirole hydrochloride; Sulfacetamide sodium; and Penicillins  Home Medications   Prior to Admission medications   Medication Sig Start Date End Date Taking? Authorizing Provider  amLODipine (NORVASC) 10 MG tablet Take 10 mg by mouth daily.     Yes  Historical Provider, MD  aspirin EC 81 MG tablet Take 81 mg by mouth daily.   Yes Historical Provider, MD  B Complex-C (B-COMPLEX WITH VITAMIN C) tablet Take 1 tablet by mouth daily.     Yes Historical Provider, MD  Cholecalciferol (VITAMIN D) 1000 UNITS capsule Take 1,000 Units by mouth daily.     Yes Historical Provider, MD  fish oil-omega-3 fatty acids 1000 MG capsule Take 1 g by mouth daily.    Yes Historical Provider, MD  latanoprost (XALATAN) 0.005 % ophthalmic solution Place 1 drop into both eyes at bedtime.    Yes Historical Provider, MD  levothyroxine (SYNTHROID, LEVOTHROID) 125 MCG tablet Take 1 tablet (125 mcg total) by mouth daily. 12/21/11  Yes Margaree Mackintosh, MD  losartan-hydrochlorothiazide (HYZAAR) 100-25 MG tablet Take 1 tablet by mouth daily.   Yes Historical Provider, MD  metoprolol succinate (TOPROL-XL) 25 MG 24 hr tablet Take 25 mg by mouth daily.   Yes Historical Provider, MD  pantoprazole (PROTONIX) 40 MG tablet Take 40 mg by mouth every other day.   Yes Historical Provider, MD  sertraline (ZOLOFT) 50 MG tablet Take 50 mg by mouth daily.   Yes Historical Provider, MD   BP 134/78 mmHg  Pulse 103  Resp 25  SpO2 98% Physical Exam  Constitutional: She is  oriented to person, place, and time. She appears well-developed and well-nourished. No distress.  HENT:  Head: Normocephalic and atraumatic.  Nose: Nose normal.  Mouth/Throat: Oropharynx is clear and moist. No oropharyngeal exudate.  Eyes: Conjunctivae and EOM are normal. Pupils are equal, round, and reactive to light. No scleral icterus.  Neck: Normal range of motion. Neck supple. No JVD present. No tracheal deviation present. No thyromegaly present.  Cardiovascular: Normal rate, regular rhythm and normal heart sounds.  Exam reveals no gallop and no friction rub.   No murmur heard. Pulmonary/Chest: Effort normal and breath sounds normal. No respiratory distress. She has no wheezes. She exhibits no tenderness.  Abdominal:  Soft. Bowel sounds are normal. She exhibits no distension and no mass. There is no tenderness. There is no rebound and no guarding.  Musculoskeletal: Normal range of motion. She exhibits no edema or tenderness.  RUE in sling, obvious deformity to right wrist; 2+ radial pulse  Laceration at PIP joint of digits 2,3 and 4  Soft tissue swelling to left lateral malleolus   Lymphadenopathy:    She has no cervical adenopathy.  Neurological: She is alert and oriented to person, place, and time. No cranial nerve deficit. She exhibits normal muscle tone.  Skin: Skin is warm and dry. No rash noted. No pallor.  Abrasion noted to right face   Nursing note and vitals reviewed.   ED Course  Procedures   DIAGNOSTIC STUDIES:  Oxygen Saturation is 97% on RA, normal by my interpretation.    COORDINATION OF CARE:  12:02 AM Discussed treatment plan with pt at bedside and pt agreed to plan.  Labs Review Labs Reviewed  BASIC METABOLIC PANEL - Abnormal; Notable for the following:    Glucose, Bld 111 (*)    All other components within normal limits  CBC WITH DIFFERENTIAL/PLATELET    Imaging Review Dg Ribs Unilateral W/chest Right  01/31/2016  CLINICAL DATA:  Stepped off pavement onto grass and fell. Concern for chest injury. Initial encounter. EXAM: RIGHT RIBS AND CHEST - 3+ VIEW COMPARISON:  Chest radiograph performed 06/26/2012 FINDINGS: No displaced rib fractures are seen. The lungs are well-aerated and clear. There is no evidence of focal opacification, pleural effusion or pneumothorax. The cardiomediastinal silhouette is mildly enlarged. No acute osseous abnormalities are seen. IMPRESSION: 1. No displaced rib fracture seen. 2. Mild cardiomegaly.  Lungs remain grossly clear. Electronically Signed   By: Roanna RaiderJeffery  Chang M.D.   On: 01/31/2016 01:29   Dg Elbow Complete Right  01/31/2016  CLINICAL DATA:  Stepped off pavement onto grass and fell. Right elbow pain. Initial encounter. EXAM: RIGHT ELBOW -  COMPLETE 3+ VIEW COMPARISON:  None. FINDINGS: There is no evidence of fracture or dislocation. The visualized joint spaces are preserved. No significant joint effusion is identified. The soft tissues are unremarkable in appearance. IMPRESSION: No evidence of fracture or dislocation. Electronically Signed   By: Roanna RaiderJeffery  Chang M.D.   On: 01/31/2016 01:29   Dg Wrist Complete Right  01/31/2016  CLINICAL DATA:  Stepped off pavement onto grass and fell. Right wrist pain and deformity. Initial encounter. EXAM: RIGHT WRIST - COMPLETE 3+ VIEW COMPARISON:  None. FINDINGS: There is a comminuted and impacted fracture at the distal radial metaphysis, with dorsal angulation and displacement. A mildly displaced ulnar styloid fracture is noted. The carpal rows articulate with the distal fragments. Soft tissue swelling is noted about the fracture site. IMPRESSION: Comminuted and impacted fracture of the distal radial metaphysis, with dorsal angulation and displacement.  Mildly displaced ulnar styloid fracture noted. Electronically Signed   By: Roanna Raider M.D.   On: 01/31/2016 01:33   Dg Ankle Complete Left  01/31/2016  CLINICAL DATA:  Stepped off pavement onto grass and fell. Left lateral ankle swelling and pain. Initial encounter. EXAM: LEFT ANKLE COMPLETE - 3+ VIEW COMPARISON:  None. FINDINGS: There is a minimally displaced fracture at the distal fibula, with overlying soft tissue swelling. The ankle mortise is intact; the interosseous space is within normal limits. No talar tilt or subluxation is seen. A small posterior calcaneal spur is seen. The joint spaces are preserved. IMPRESSION: Minimally displaced fracture of the distal fibula, with overlying soft tissue swelling. Electronically Signed   By: Roanna Raider M.D.   On: 01/31/2016 01:33   Ct Head Wo Contrast  01/31/2016  CLINICAL DATA:  Larey Seat getting out of her truck, landing on asphalt EXAM: CT HEAD WITHOUT CONTRAST TECHNIQUE: Contiguous axial images were  obtained from the base of the skull through the vertex without intravenous contrast. COMPARISON:  None. FINDINGS: There is no intracranial hemorrhage or extra-axial fluid collection. There is mild generalized atrophy. Gray matter and white matter are unremarkable, with normal differentiation. No acute findings are evident. Calvarium and skullbase are intact. Orbits are intact. There is complete opacification of the right frontal sinus. There is membrane thickening in the ethmoids, sphenoids and left frontal sinus. IMPRESSION: 1. Negative for acute intracranial traumatic injury. There is mild generalized atrophy. 2. Severe paranasal sinus disease, greatest in the right frontal sinus. Electronically Signed   By: Ellery Plunk M.D.   On: 01/31/2016 01:54   Dg Hand Complete Right  01/31/2016  CLINICAL DATA:  Stepped off pavement onto grass and fell. Lacerations about the right third, fourth and fifth digits, and right wrist pain and deformity. Initial encounter. EXAM: RIGHT HAND - COMPLETE 3+ VIEW COMPARISON:  None. FINDINGS: There is a comminuted and impacted fracture of the distal radial metaphysis. There is dorsal displacement and dorsal angulation of the distal radial fragments. A mildly displaced ulnar styloid fracture is also seen. The carpal rows articulate with the distal fragments. Soft tissue swelling is noted about the wrist. IMPRESSION: 1. Comminuted and impacted fracture of the distal radial metaphysis. Dorsal displacement and dorsal angulation of the distal radial fragments. 2. Mildly displaced ulnar styloid fracture noted. Electronically Signed   By: Roanna Raider M.D.   On: 01/31/2016 01:31   I have personally reviewed and evaluated these images and lab results as part of my medical decision-making.   EKG Interpretation None       Procedural sedation Performed by: Tomasita Crumble Consent: Verbal consent obtained. Risks and benefits: risks, benefits and alternatives were  discussed Required items: required blood products, implants, devices, and special equipment available Patient identity confirmed: arm band and provided demographic data Time out: Immediately prior to procedure a "time out" was called to verify the correct patient, procedure, equipment, support staff and site/side marked as required.  Sedation type: moderate (conscious) sedation NPO time confirmed and considedered  Sedatives: KETAMINE   Physician Time at Bedside:  Vitals: Vital signs were monitored during sedation. Cardiac Monitor, pulse oximeter Patient tolerance: Patient tolerated the procedure well with no immediate complications. Comments: Pt with uneventful recovered. Returned to pre-procedural sedation baseline   Reduction of dislocation Date/Time: 5:45 AM Performed by: Tomasita Crumble Authorized byTomasita Crumble Consent: Verbal consent obtained. Risks and benefits: risks, benefits and alternatives were discussed Consent given by: patient Required items: required blood products, implants, devices,  and special equipment available Time out: Immediately prior to procedure a "time out" was called to verify the correct patient, procedure, equipment, support staff and site/side marked as required.  Patient sedated: Ketamine  Vitals: Vital signs were monitored during sedation. Patient tolerance: Patient tolerated the procedure well with no immediate complications. Joint: R wrist Reduction technique: Traction-counter traction with manipulation   LACERATION REPAIR Performed by: Tomasita Crumble, MD Consent: Verbal consent obtained. Risks and benefits: risks, benefits and alternatives were discussed Patient identity confirmed: provided demographic data Time out performed prior to procedure Prepped and Draped in normal sterile fashion Wound explored Laceration Location: R 3rd digit Laceration Length: 1cm No Foreign Bodies seen or palpated Anesthesia: local infiltration Local  anesthetic: lidocaine 1% with epinephrine Anesthetic total: 2 ml Irrigation method: syringe Amount of cleaning: standard Skin closure: sutures 4-0 ethilon Number of sutures or staples: 4 Technique: simple interrupted Patient tolerance: Patient tolerated the procedure well with no immediate complications.  LACERATION REPAIR Performed by: Tomasita Crumble Authorized byTomasita Crumble Consent: Verbal consent obtained. Risks and benefits: risks, benefits and alternatives were discussed Consent given by: patient Patient identity confirmed: provided demographic data Prepped and Draped in normal sterile fashion Wound explored  Laceration Location: R 4th digit  Laceration Length: 1cm  No Foreign Bodies seen or palpated  Anesthesia: local infiltration  Local anesthetic: lidocaine 1% with epinephrine  Anesthetic total: 1 ml  Irrigation method: syringe Amount of cleaning: standard  Skin closure: sutures  Number of sutures: 1  Technique: simple interrupted  Patient tolerance: Patient tolerated the procedure well with no immediate complications.   SPLINT APPLICATION Date/Time: 01/31/16 3:28 AM Authorized by: Tomasita Crumble, MD Consent: Verbal consent obtained. Risks and benefits: risks, benefits and alternatives were discussed Consent given by: patient Splint applied by: orthopedic technician Location details: R wrist Splint type: sugar tong Supplies used: fiberglass Post-procedure: The splinted body part was neurovascularly unchanged following the procedure. Patient tolerance: Patient tolerated the procedure well with no immediate complications.  SPLINT APPLICATION Date/Time: 01/31/16 3:28 AM Authorized by: Tomasita Crumble, MD Consent: Verbal consent obtained. Risks and benefits: risks, benefits and alternatives were discussed Consent given by: patient Splint applied by: orthopedic technician Location details: LLE Splint type: posterior short leg and stirrup splints Supplies used:  fiberglass Post-procedure: The splinted body part was neurovascularly unchanged following the procedure. Patient tolerance: Patient tolerated the procedure well with no immediate complications.   MDM   Final diagnoses:  None   Patient presents to the ED after a mechanical fall. She denies prodromal symptoms.  Obvious wrist deformity, wil obtain xrays of painful areas.  She was given morphine for pain control, tetanus updated.  Injuries appropriately reduced and splinted.  Lacs were repaired.  Patient feels better after procedures.  She was given ortho follow up to see regarding fracture.  Post reduction films reveal some slight deformity remaining but improved.  Will give oxycodone to take at home for breakthrough pain.  She appears well and in NAD.  VS remain within her normal limits and she is safe for DC.  I personally performed the services described in this documentation, which was scribed in my presence. The recorded information has been reviewed and is accurate.      Tomasita Crumble, MD 01/31/16 [phone]

## 2016-01-31 ENCOUNTER — Emergency Department (HOSPITAL_COMMUNITY): Payer: Medicare HMO

## 2016-01-31 LAB — CBC WITH DIFFERENTIAL/PLATELET
BASOS PCT: 0 %
Basophils Absolute: 0 10*3/uL (ref 0.0–0.1)
EOS ABS: 0.3 10*3/uL (ref 0.0–0.7)
EOS PCT: 3 %
HCT: 37.7 % (ref 36.0–46.0)
Hemoglobin: 12 g/dL (ref 12.0–15.0)
Lymphocytes Relative: 20 %
Lymphs Abs: 1.9 10*3/uL (ref 0.7–4.0)
MCH: 27 pg (ref 26.0–34.0)
MCHC: 31.8 g/dL (ref 30.0–36.0)
MCV: 84.9 fL (ref 78.0–100.0)
MONO ABS: 0.8 10*3/uL (ref 0.1–1.0)
MONOS PCT: 8 %
NEUTROS PCT: 69 %
Neutro Abs: 6.6 10*3/uL (ref 1.7–7.7)
PLATELETS: 235 10*3/uL (ref 150–400)
RBC: 4.44 MIL/uL (ref 3.87–5.11)
RDW: 13.5 % (ref 11.5–15.5)
WBC: 9.6 10*3/uL (ref 4.0–10.5)

## 2016-01-31 LAB — BASIC METABOLIC PANEL
Anion gap: 13 (ref 5–15)
BUN: 13 mg/dL (ref 6–20)
CALCIUM: 9.2 mg/dL (ref 8.9–10.3)
CO2: 22 mmol/L (ref 22–32)
CREATININE: 0.85 mg/dL (ref 0.44–1.00)
Chloride: 105 mmol/L (ref 101–111)
GFR calc non Af Amer: >60 mL/min (ref >60)
Glucose, Bld: 111 mg/dL — ABNORMAL HIGH (ref 65–99)
Potassium: 3.8 mmol/L (ref 3.5–5.1)
SODIUM: 140 mmol/L (ref 135–145)

## 2016-01-31 MED ORDER — SODIUM CHLORIDE 0.9 % IV BOLUS (SEPSIS)
1000.0000 mL | Freq: Once | INTRAVENOUS | Status: AC
  Administered 2016-01-31: 1000 mL via INTRAVENOUS

## 2016-01-31 MED ORDER — KETAMINE HCL 10 MG/ML IJ SOLN: 160.0000 mg | Freq: Once | INTRAMUSCULAR | Status: DC

## 2016-01-31 MED ORDER — KETAMINE HCL 10 MG/ML IJ SOLN
INTRAMUSCULAR | Status: AC | PRN
  Administered 2016-01-31: 100 mg via INTRAVENOUS

## 2016-01-31 MED ORDER — FENTANYL CITRATE (PF) 100 MCG/2ML IJ SOLN: 100.0000 ug | Freq: Once | INTRAMUSCULAR | Status: DC

## 2016-01-31 MED ORDER — OXYCODONE HCL 5 MG PO TABS: 5.0000 mg | ORAL_TABLET | Freq: Two times a day (BID) | ORAL | Status: DC | PRN

## 2016-01-31 MED ORDER — KETAMINE HCL 10 MG/ML IJ SOLN
160.0000 mg | Freq: Once | INTRAMUSCULAR | Status: DC
  Filled 2016-01-31: qty 16

## 2016-01-31 MED ORDER — MORPHINE SULFATE (PF) 4 MG/ML IV SOLN
6.0000 mg | Freq: Once | INTRAVENOUS | Status: AC
  Administered 2016-01-31: 6 mg via INTRAVENOUS
  Filled 2016-01-31 (×2): qty 2

## 2016-01-31 MED ORDER — TETANUS-DIPHTH-ACELL PERTUSSIS 5-2.5-18.5 LF-MCG/0.5 IM SUSP
0.5000 mL | Freq: Once | INTRAMUSCULAR | Status: AC
  Administered 2016-01-31: 0.5 mL via INTRAMUSCULAR
  Filled 2016-01-31: qty 0.5

## 2016-01-31 MED ORDER — LIDOCAINE-EPINEPHRINE 1 %-1:100000 IJ SOLN
10.0000 mL | Freq: Once | INTRAMUSCULAR | Status: AC
  Administered 2016-01-31: 10 mL via INTRADERMAL
  Filled 2016-01-31: qty 1

## 2016-01-31 MED ORDER — MORPHINE SULFATE (PF) 4 MG/ML IV SOLN
6.0000 mg | Freq: Once | INTRAVENOUS | Status: AC
  Administered 2016-01-31: 6 mg via INTRAVENOUS
  Filled 2016-01-31: qty 2

## 2016-01-31 NOTE — Discharge Instructions (Signed)
Undisplaced Fibular Ankle Fracture Treated With Immobilization, Adult [name], your need to see orthopaedic surgery within 3 days for close follow up of your broken bones.  Do not bear any weight on our leg.  Take tylenol or ibuprofen as needed for pain, take oxycodone if the pain is severe.  Come back to the ED immediately for any worsening symptoms or if you feel the cast is on too tightly. Thank you. A simple fracture of the bone below the knee on the outside of your leg (fibula) usually heals without problems. CAUSES Typically, a fibular fracture occurs as a result of trauma. A blow to the side of your leg or a powerful twisting movement can cause a fracture. Fibular fractures are often seen as a result of football, soccer, or skiing injuries. SYMPTOMS Symptoms of a fibular fracture can include:  Pain.  Shortening or abnormal alignment of your lower leg (angulation). DIAGNOSIS A health care provider will need to examine the leg. X-ray exams will be ordered for further to confirm the fracture and evaluate the extent and of the injury. TREATMENT  Typically, a cast or immobilizer is applied. Sometimes a splint is placed on these fractures if it is needed for comfort or if the bones are badly out of place. Crutches may be needed to help you get around.  HOME CARE INSTRUCTIONS   Apply ice to the injured area:  Put ice in a plastic bag.  Place a towel between your skin and the bag.  Leave the ice on for 20 minutes, 2-3 times a day.  Use crutches as directed. Resume walking without crutches as directed by your health care provider or when comfortable doing so.  Only take over-the-counter or prescription medicines for pain, discomfort, or fever as directed by your health care provider.  Keeping your leg raised may lessen swelling.  If you have a removable splint or boot, do not remove the boot unless directed by your health care provider.  Do not not drive a car or operate a motor  vehicle until your health care provider specifically tells you it is safe to do so. SEEK IMMEDIATE MEDICAL CARE IF:   Your cast gets damaged or breaks.  You have continued severe pain or more swelling than you did before the cast was put on, or the pain is not controlled with medications.  Your skin or nails below the injury turn blue or grey, or feel cold or numb.  There is a bad smell or pus coming from under the cast.  You develop severe pain in ankle or foot. MAKE SURE YOU:   Understand these instructions.  Will watch your condition.  Will get help right away if you are not doing well or get worse.   This information is not intended to replace advice given to you by your health care provider. Make sure you discuss any questions you have with your health care provider.   Document Released: 06/26/2002 Document Revised: 10/25/2014 Document Reviewed: 05/16/2013 Elsevier Interactive Patient Education 2016 Elsevier Inc[employer] Radial Fracture A radial fracture is a break in the radius bone, which is the long bone of the forearm that is on the same side as your thumb. Your forearm is the part of your arm that is between your elbow and your wrist. It is made up of two bones: the radius and the ulna. Most radial fractures occur near the wrist (distal radialfracture) or near the elbow (radial head fracture). A distal radial fracture is the most  common type of broken arm. This fracture usually occurs about an inch above the wrist. Fractures of the middle part of the bone are less common. CAUSES  Falling with your arm outstretched is the most common cause of a radial fracture. Other causes include:  Car accidents.  Bike accidents.  A direct blow to the middle part of the radius. RISK FACTORS  You may be at greater risk for a distal radial fracture if you are 41 years of age or older.  You may be at greater risk for a radial head fracture if you are:  Female.  40-26 years old.  You  may be at a greater risk for all types of radial fractures if you have a condition that causes your bones to be weak or thin (osteoporosis). SIGNS AND SYMPTOMS A radial fracture causes pain immediately after the injury. Other signs and symptoms include:  An abnormal bend or bump in your arm (deformity).  Swelling.  Bruising.  Numbness or tingling.  Tenderness.  Limited movement. DIAGNOSIS  Your health care provider may diagnose a radial fracture based on:  Your symptoms.  Your medical history, including any recent injury.  A physical exam. Your health care provider will look for any deformity and feel for tenderness over the break. Your health care provider will also check whether the bone is out of place.  An X-ray exam to confirm the diagnosis and learn more about the type of fracture. TREATMENT The goals of treatment are to get the bone in proper position for healing and to keep it from moving so it will heal over time. Your treatment will depend on many factors, especially the type of fracture that you have.  If the fractured bone:  Is in the correct position (nondisplaced), you may only need to wear a cast or a splint.  Has a slightly displaced fracture, you may need to have the bones moved back into place manually (closed reduction) before the splint or cast is put on.  You may have a temporary splint before you have a plaster cast. The splint allows room for some swelling. After a few days, a cast can replace the splint.  You may have to wear the cast for about 6 weeks or as directed by your health care provider.  The cast may be changed after about 3 weeks or as directed by your health care provider.  After your cast is taken off, you may need physical therapy to regain full movement in your wrist or elbow.  You may need emergency surgery if you have:  A fractured bone that is out of position (displaced).  A fracture with multiple fragments (comminuted  fracture).  A fracture that breaks the skin (open fracture). This type of fracture may require surgical wires, plates, or screws to hold the bone in place.  You may have X-rays every couple of weeks to check on your healing. HOME CARE INSTRUCTIONS  Keep the injured arm above the level of your heart while you are sitting or lying down. This helps to reduce swelling and pain.  Apply ice to the injured area:  Put ice in a plastic bag.  Place a towel between your skin and the bag.  Leave the ice on for 20 minutes, 2-3 times per day.  Move your fingers often to avoid stiffness and to minimize swelling.  If you have a plaster or fiberglass cast:  Do not try to scratch the skin under the cast using sharp or pointed  objects.  Check the skin around the cast every day. You may put lotion on any red or sore areas.  Keep your cast dry and clean.  If you have a plaster splint:  Wear the splint as directed.  Loosen the elastic around the splint if your fingers become numb and tingle, or if they turn cold and blue.  Do not put pressure on any part of your cast until it is fully hardened. Rest your cast only on a pillow for the first 24 hours.  Protect your cast or splint while bathing or showering, as directed by your health care provider. Do not put your cast or splint into water.  Take medicines only as directed by your health care provider.  Return to activities, such as sports, as directed by your health care provider. Ask your health care provider what activities are safe for you.  Keep all follow-up visits as directed by your health care provider. This is important. SEEK MEDICAL CARE IF:  Your pain medicine is not helping.  Your cast gets damaged or it breaks.  Your cast becomes loose.  Your cast gets wet.  You have more severe pain or swelling than you did before the cast.  You have severe pain when stretching your fingers.  You continue to have pain or stiffness in  your elbow or your wrist after your cast is taken off. SEEK IMMEDIATE MEDICAL CARE IF:  You cannot move your fingers.  You lose feeling in your fingers or your hand.  Your hand or your fingers turn cold and pale or blue.  You notice a bad smell coming from your cast.  You have drainage from underneath your cast.  You have new stains from blood or drainage seeping through your cast.   This information is not intended to replace advice given to you by your health care provider. Make sure you discuss any questions you have with your health care provider.   Document Released: 03/17/2006 Document Revised: 10/25/2014 Document Reviewed: 03/29/2014 Elsevier Interactive Patient Education 2016 Elsevier Inc. Laceration Care, Kimberly A laceration is a cut that goes through all layers of the skin. The cut also goes into the tissue that is right under the skin. Some cuts heal on their own. Others need to be closed with stitches (sutures), staples, skin adhesive strips, or wound glue. Taking care of your cut lowers your risk of infection and helps your cut to heal better. HOW TO TAKE CARE OF YOUR CUT For stitches or staples:  Keep the wound clean and dry.  If you were given a bandage (dressing), you should change it at least one time per day or as told by your doctor. You should also change it if it gets wet or dirty.  Keep the wound completely dry for the first 24 hours or as told by your doctor. After that time, you may take a shower or a bath. However, make sure that the wound is not soaked in water until after the stitches or staples have been removed.  Clean the wound one time each day or as told by your doctor:  Wash the wound with soap and water.  Rinse the wound with water until all of the soap comes off.  Pat the wound dry with a clean towel. Do not rub the wound.  After you clean the wound, put a thin layer of antibiotic ointment on it as told by your doctor. This ointment:  Helps  to prevent infection.  Keeps the  bandage from sticking to the wound.  Have your stitches or staples removed as told by your doctor. If your doctor used skin adhesive strips:   Keep the wound clean and dry.  If you were given a bandage, you should change it at least one time per day or as told by your doctor. You should also change it if it gets dirty or wet.  Do not get the skin adhesive strips wet. You can take a shower or a bath, but be careful to keep the wound dry.  If the wound gets wet, pat it dry with a clean towel. Do not rub the wound.  Skin adhesive strips fall off on their own. You can trim the strips as the wound heals. Do not remove any strips that are still stuck to the wound. They will fall off after a while. If your doctor used wound glue:  Try to keep your wound dry, but you may briefly wet it in the shower or bath. Do not soak the wound in water, such as by swimming.  After you take a shower or a bath, gently pat the wound dry with a clean towel. Do not rub the wound.  Do not do any activities that will make you really sweaty until the skin glue has fallen off on its own.  Do not apply liquid, cream, or ointment medicine to your wound while the skin glue is still on.  If you were given a bandage, you should change it at least one time per day or as told by your doctor. You should also change it if it gets dirty or wet.  If a bandage is placed over the wound, do not let the tape for the bandage touch the skin glue.  Do not pick at the glue. The skin glue usually stays on for 5-10 days. Then, it falls off of the skin. General Instructions  To help prevent scarring, make sure to cover your wound with sunscreen whenever you are outside after stitches are removed, after adhesive strips are removed, or when wound glue stays in place and the wound is healed. Make sure to wear a sunscreen of at least 30 SPF.  Take over-the-counter and prescription medicines only as told  by your doctor.  If you were given antibiotic medicine or ointment, take or apply it as told by your doctor. Do not stop using the antibiotic even if your wound is getting better.  Do not scratch or pick at the wound.  Keep all follow-up visits as told by your doctor. This is important.  Check your wound every day for signs of infection. Watch for:  Redness, swelling, or pain.  Fluid, blood, or pus.  Raise (elevate) the injured area above the level of your heart while you are sitting or lying down, if possible. GET HELP IF:  You got a tetanus shot and you have any of these problems at the injection site:  Swelling.  Very bad pain.  Redness.  Bleeding.  You have a fever.  A wound that was closed breaks open.  You notice a bad smell coming from your wound or your bandage.  You notice something coming out of the wound, such as wood or glass.  Medicine does not help your pain.  You have more redness, swelling, or pain at the site of your wound.  You have fluid, blood, or pus coming from your wound.  You notice a change in the color of your skin near your wound.  You need to change the bandage often because fluid, blood, or pus is coming from the wound.  You start to have a new rash.  You start to have numbness around the wound. GET HELP RIGHT AWAY IF:  You have very bad swelling around the wound.  Your pain suddenly gets worse and is very bad.  You notice painful lumps near the wound or on skin that is anywhere on your body.  You have a red streak going away from your wound.  The wound is on your hand or foot and you cannot move a finger or toe like you usually can.  The wound is on your hand or foot and you notice that your fingers or toes look pale or bluish.   This information is not intended to replace advice given to you by your health care provider. Make sure you discuss any questions you have with your health care provider.   Document Released:  03/22/2008 Document Revised: 02/18/2015 Document Reviewed: 09/30/2014 Elsevier Interactive Patient Education 2016 Elsevier Inc. Cast or Splint Care Casts and splints support injured limbs and keep bones from moving while they heal.  HOME CARE  Keep the cast or splint uncovered during the drying period.  A plaster cast can take 24 to 48 hours to dry.  A fiberglass cast will dry in less than 1 hour.  Do not rest the cast on anything harder than a pillow for 24 hours.  Do not put weight on your injured limb. Do not put pressure on the cast. Wait for your doctor's approval.  Keep the cast or splint dry.  Cover the cast or splint with a plastic bag during baths or wet weather.  If you have a cast over your chest and belly (trunk), take sponge baths until the cast is taken off.  If your cast gets wet, dry it with a towel or blow dryer. Use the cool setting on the blow dryer.  Keep your cast or splint clean. Wash a dirty cast with a damp cloth.  Do not put any objects under your cast or splint.  Do not scratch the skin under the cast with an object. If itching is a problem, use a blow dryer on a cool setting over the itchy area.  Do not trim or cut your cast.  Do not take out the padding from inside your cast.  Exercise your joints near the cast as told by your doctor.  Raise (elevate) your injured limb on 1 or 2 pillows for the first 1 to 3 days. GET HELP IF:  Your cast or splint cracks.  Your cast or splint is too tight or too loose.  You itch badly under the cast.  Your cast gets wet or has a soft spot.  You have a bad smell coming from the cast.  You get an object stuck under the cast.  Your skin around the cast becomes red or sore.  You have new or more pain after the cast is put on. GET HELP RIGHT AWAY IF:  You have fluid leaking through the cast.  You cannot move your fingers or toes.  Your fingers or toes turn blue or white or are cool, painful, or puffy  (swollen).  You have tingling or lose feeling (numbness) around the injured area.  You have bad pain or pressure under the cast.  You have trouble breathing or have shortness of breath.  You have chest pain.   This information is not intended to  replace advice given to you by your health care provider. Make sure you discuss any questions you have with your health care provider. °  °Document Released: 02/03/2011 Document Revised: 06/06/2013 Document Reviewed: 04/12/2013 °Elsevier Interactive Patient Education ©2016 Elsevier Inc. ° °

## 2016-01-31 NOTE — ED Notes (Signed)
Consent form at bedside.  

## 2016-01-31 NOTE — Progress Notes (Signed)
Orthopedic Tech Progress Note Patient Details:  Kimberly GibsonSheila D Robles 07/01/1946 [id number]  Ortho Devices Type of Ortho Device: Buddy tape, Arm sling, Other (comment), Stirrup splint, Post (short leg) splint Ortho Device/Splint Location: rue reverse sugartong, lle posterior and stirrup Ortho Device/Splint Interventions: Ordered, Application   Trinna PostMartinez, Ronnell Clinger J 01/31/2016, 4:05 AM

## 2016-01-31 NOTE — ED Notes (Signed)
MD ordered 160mg  IV ketamine. Pharmacy sent 200mg  IV ketamine. MD at bedside, verbal order to give only 100mg  IV ketamine. Remaining 100mg  ketamine wasted with Cricket, RN witness.

## 2016-01-31 NOTE — ED Notes (Signed)
Spoke with ortho tech about a cane, stated they would go look in stockroom and let us know.

## 2016-01-31 NOTE — ED Notes (Signed)
Pt asking about x-ray for left ankle and left thumb/hand.  Dr. Mora Bellmanni notified.

## 2016-01-31 NOTE — ED Notes (Signed)
Pt returned from X-ray.  

## 2016-04-15 ENCOUNTER — Encounter: Payer: Self-pay | Admitting: Allergy and Immunology

## 2016-04-15 ENCOUNTER — Ambulatory Visit (INDEPENDENT_AMBULATORY_CARE_PROVIDER_SITE_OTHER): Payer: Medicare HMO | Admitting: Allergy and Immunology

## 2016-04-15 VITALS — BP 124/80 | HR 72 | Resp 20 | Ht 67.0 in | Wt 192.0 lb

## 2016-04-15 DIAGNOSIS — J029 Acute pharyngitis, unspecified: Secondary | ICD-10-CM | POA: Diagnosis not present

## 2016-04-15 DIAGNOSIS — J4520 Mild intermittent asthma, uncomplicated: Secondary | ICD-10-CM

## 2016-04-15 DIAGNOSIS — J309 Allergic rhinitis, unspecified: Secondary | ICD-10-CM | POA: Diagnosis not present

## 2016-04-15 DIAGNOSIS — H1010 Acute atopic conjunctivitis, unspecified eye: Secondary | ICD-10-CM

## 2016-04-15 DIAGNOSIS — K219 Gastro-esophageal reflux disease without esophagitis: Secondary | ICD-10-CM

## 2016-04-15 DIAGNOSIS — J019 Acute sinusitis, unspecified: Secondary | ICD-10-CM

## 2016-04-15 DIAGNOSIS — J387 Other diseases of larynx: Secondary | ICD-10-CM

## 2016-04-15 MED ORDER — CEFUROXIME AXETIL 250 MG PO TABS: ORAL_TABLET | ORAL | Status: DC

## 2016-04-15 MED ORDER — ALBUTEROL SULFATE HFA 108 (90 BASE) MCG/ACT IN AERS: INHALATION_SPRAY | RESPIRATORY_TRACT | Status: DC

## 2016-04-15 MED ORDER — METHYLPREDNISOLONE ACETATE 80 MG/ML IJ SUSP
80.0000 mg | Freq: Once | INTRAMUSCULAR | Status: AC
  Administered 2016-04-15: 80 mg via INTRAMUSCULAR

## 2016-04-15 MED ORDER — MONTELUKAST SODIUM 10 MG PO TABS: ORAL_TABLET | ORAL | Status: DC

## 2016-04-15 NOTE — Patient Instructions (Addendum)
  1. Depo-Medrol 80 IM delivered in clinic  2. Ceftin 250 mg one tablet twice a day for 10 days  3. Protonix 40mg  in morning plus OTC ranitidine 150 2 tablets in evening  4. Montelukast 10 mg one tablet once a day  5. OTC Rhinocort one spray each nostril one time per day  6. OTC Mucinex DM 2 tablets twice a day  7. OTC cetirizine 10 mg one tablet one time per day  8. If needed ProAir HFA 2 puffs every 4-6 hours  9. Further evaluation?

## 2016-04-15 NOTE — Progress Notes (Signed)
Follow-up Note  Referring Provider: Marden NobleGates, Robert, MD Primary Provider: Pearla DubonnetGATES,ROBERT NEVILL, MD Date of Office Visit: 04/15/2016  Subjective:   [name] (DOB: 05/08/1946) is a 70 y.o. female who [facility] in re-evaluation of the following:  HPI: [name] presents this clinic in evaluation of respiratory tract symptoms. I have not seen her in his clinic since July 2015.  Apparently she was doing relatively well but unfortunately over the course of the past week or so she developed an episode of sore throat and raspy voice and coughing and ear fullness and achiness and went to the urgent care center on Monday was not treated with any specific therapy. She does not use any of her medications on a regular basis except pantoprazole a few times per week. Sometimes she'll add a rare Nasonex and a rare ranitidine. Apparently she has not had to use any inhaled steroids or leukotriene modifier or a short acting bronchodilator and a prolonged period in time.  [name] mentions to me that she had a fall and broke her leg and arm in April. She has not had a bone scan looking for osteoporosis.    Medication List           amLODipine 10 MG tablet  Commonly known as:  NORVASC  Take 10 mg by mouth daily.     aspirin EC 81 MG tablet  Take 81 mg by mouth daily.     B-complex with vitamin C tablet  Take 1 tablet by mouth daily.     fish oil-omega-3 fatty acids 1000 MG capsule  Take 1 g by mouth daily.     latanoprost 0.005 % ophthalmic solution  Commonly known as:  XALATAN  Place 1 drop into both eyes at bedtime.     levothyroxine 125 MCG tablet  Commonly known as:  SYNTHROID, LEVOTHROID  Take 1 tablet (125 mcg total) by mouth daily.     losartan-hydrochlorothiazide 100-25 MG tablet  Commonly known as:  HYZAAR  Take 1 tablet by mouth daily.     metoprolol succinate 25 MG 24 hr tablet  Commonly known as:  TOPROL-XL  Take 25 mg by mouth  daily.     pantoprazole 40 MG tablet  Commonly known as:  PROTONIX  Take 40 mg by mouth every other day.     sertraline 50 MG tablet  Commonly known as:  ZOLOFT  Take 50 mg by mouth daily.     Vitamin D 1000 units capsule  Take 1,000 Units by mouth daily.        Past Medical History  Diagnosis Date  . Hypertension   . Thyroid disease   . GERD (gastroesophageal reflux disease)   . Hyperlipidemia   . Glaucoma   . Osteopenia   . Anxiety   . Macular degeneration     Past Surgical History  Procedure Laterality Date  . Cesarean section    . Appendectomy    . Forearm surgery      Allergies  Allergen Reactions  . Ropinirole Hydrochloride Other (See Comments)    Crazy feeling  . Sulfacetamide Sodium Other (See Comments)    Bad reaction many years ago - doesn't remember details  . Penicillins Rash    Review of systems negative except as noted in HPI / PMHx or noted below:  Review of Systems  Constitutional: Negative.   HENT: Negative.   Eyes: Negative.   Respiratory: Negative.   Cardiovascular:  Negative.   Gastrointestinal: Negative.   Genitourinary: Negative.   Musculoskeletal: Negative.   Skin: Negative.   Neurological: Negative.   Endo/Heme/Allergies: Negative.   Psychiatric/Behavioral: Negative.      Objective:   Filed Vitals:   04/15/16 1130  BP: 124/80  Pulse: 72  Resp: 20   Height: 5\' 7"  (170.2 cm)  Weight: 192 lb (87.091 kg)   Physical Exam  Constitutional: She is well-developed, well-nourished, and in no distress.  Slightly raspy voice, slight cough  HENT:  Head: Normocephalic.  Right Ear: Tympanic membrane, external ear and ear canal normal.  Left Ear: Tympanic membrane, external ear and ear canal normal.  Nose: Mucosal edema present. No rhinorrhea.  Mouth/Throat: Uvula is midline, oropharynx is clear and moist and mucous membranes are normal. No oropharyngeal exudate.  Eyes: Conjunctivae are normal.  Neck: Trachea normal. No tracheal  tenderness present. No tracheal deviation present. No thyromegaly present.  Cardiovascular: Normal rate, regular rhythm, S1 normal, S2 normal and normal heart sounds.   No murmur heard. Pulmonary/Chest: Breath sounds normal. No stridor. No respiratory distress. She has no wheezes. She has no rales.  Musculoskeletal: She exhibits no edema.  Lymphadenopathy:       Head (right side): No tonsillar adenopathy present.       Head (left side): No tonsillar adenopathy present.    She has no cervical adenopathy.  Neurological: She is alert. Gait normal.  Skin: No rash noted. She is not diaphoretic. No erythema. Nails show no clubbing.  Psychiatric: Mood and affect normal.    Diagnostics:    Spirometry was performed and demonstrated an FEV1 of 2.23 at 85 % of predicted.  The patient had an Asthma Control Test with the following results:  .    Assessment and Plan:   1. Asthma, mild intermittent, well-controlled   2. LPRD (laryngopharyngeal reflux disease)   3. Allergic rhinoconjunctivitis   4. Pharyngitis   5. Acute sinusitis, recurrence not specified, unspecified location     1. Depo-Medrol 80 IM delivered in clinic  2. Ceftin 250 mg one tablet twice a day for 10 days  3. Protonix 40mg  in morning plus OTC ranitidine 150 2 tablets in evening  4. Montelukast 10 mg one tablet once a day  5. OTC Rhinocort one spray each nostril one time per day  6. OTC Mucinex DM 2 tablets twice a day  7. OTC cetirizine 10 mg one tablet one time per day  8. If needed ProAir HFA 2 puffs every 4-6 hours  9. Further evaluation?  [name] appears to have respiratory tract inflammation and I suspect that there is probably an infectious component giving rise to this issue and we will treat her with broad-spectrum antibiotics as well as anti-inflammatory agents as noted above and get her to be a little bit more aggressive And consistent about treating her reflux given her previous history of LPR. I will see  her back in this clinic if she does not do well with this plan. I do not think that she will return to this clinic if she does well as she is not particularly that good about performing follow-up. I did have a talk with her today about her multiple bone fractures and how she should obtain a bone density scan directed through her primary care physician at some point in the near future.  Laurette SchimkeEric Kozlow, MD [facility]

## 2016-04-28 ENCOUNTER — Telehealth: Payer: Self-pay | Admitting: Allergy and Immunology

## 2016-04-28 NOTE — Telephone Encounter (Signed)
Coughing that brings up little mucus. Mostly dry.  Can anything be called in? She finished antibiotics yesterday.  [facility]

## 2016-04-29 ENCOUNTER — Other Ambulatory Visit: Payer: Self-pay | Admitting: *Deleted

## 2016-04-29 MED ORDER — CEFUROXIME AXETIL 250 MG PO TABS: ORAL_TABLET | ORAL | Status: DC

## 2016-04-29 NOTE — Telephone Encounter (Signed)
RX sent left message to inform patient

## 2016-04-29 NOTE — Telephone Encounter (Signed)
Please have patient utilize another 10 days of Ceftin 250 mg twice a day

## 2016-04-29 NOTE — Telephone Encounter (Signed)
Resending

## 2016-04-30 NOTE — Telephone Encounter (Signed)
Pt informed

## 2016-05-19 ENCOUNTER — Ambulatory Visit (INDEPENDENT_AMBULATORY_CARE_PROVIDER_SITE_OTHER): Payer: Medicare HMO | Admitting: Allergy and Immunology

## 2016-05-19 ENCOUNTER — Encounter: Payer: Self-pay | Admitting: Allergy and Immunology

## 2016-05-19 VITALS — BP 120/70 | HR 60 | Resp 18

## 2016-05-19 DIAGNOSIS — J387 Other diseases of larynx: Secondary | ICD-10-CM | POA: Diagnosis not present

## 2016-05-19 DIAGNOSIS — H1010 Acute atopic conjunctivitis, unspecified eye: Secondary | ICD-10-CM | POA: Diagnosis not present

## 2016-05-19 DIAGNOSIS — J309 Allergic rhinitis, unspecified: Secondary | ICD-10-CM | POA: Diagnosis not present

## 2016-05-19 DIAGNOSIS — J4520 Mild intermittent asthma, uncomplicated: Secondary | ICD-10-CM

## 2016-05-19 DIAGNOSIS — R059 Cough, unspecified: Secondary | ICD-10-CM

## 2016-05-19 DIAGNOSIS — R05 Cough: Secondary | ICD-10-CM

## 2016-05-19 DIAGNOSIS — K219 Gastro-esophageal reflux disease without esophagitis: Secondary | ICD-10-CM

## 2016-05-19 MED ORDER — AZITHROMYCIN 500 MG PO TABS: ORAL_TABLET | ORAL | 0 refills | Status: DC

## 2016-05-19 NOTE — Patient Instructions (Addendum)
  1. Obtain a chest x-ray  2.  Azithromycin 500 mg once a day for 3 days  3. Protonix 40mg  twice a day plus OTC ranitidine 150 2 tablets in evening  4. Montelukast 10 mg one tablet once a day  5. OTC Rhinocort one spray each nostril one time per day  6. OTC Mucinex DM 2 tablets twice a day  7. OTC cetirizine 10 mg one tablet one time per day  8. If needed ProAir HFA 2 puffs every 4-6 hours  9. Call with update in 2 weeks - further evaluation?

## 2016-05-19 NOTE — Progress Notes (Signed)
Follow-up Note  Referring Provider: Marden Noble, MD Primary Provider: Pearla Dubonnet, MD Date of Office Visit: 05/19/2016  Subjective:   [name] (DOB: Sep 22, 1946) is a 70 y.o. female [facility] in re-evaluation of the following:  HPI: [name] returns to this clinic in evaluation of her cough. When I last saw her in his clinic approximately one month ago she presented with a cough as well as signs of an infection affecting her upper respiratory tract and we treated her with antibiotics and systemic steroids and placed her on a plan to address her pre-existing reflux-induced respiratory disease and her inflammatory respiratory disease. She has not responded well regarding her cough. She still coughing still hoarse. Her sore throat and ear fullness and nasal symptoms have completely abated. She is not been very consistent about using medical therapy on a regular basis.    Medication List      albuterol 108 (90 Base) MCG/ACT inhaler Commonly known as:  PROAIR HFA Inhale two puffs every four to six hours as needed for cough or wheeze.   amLODipine 10 MG tablet Commonly known as:  NORVASC Take 10 mg by mouth daily.   aspirin EC 81 MG tablet Take 81 mg by mouth daily.   B-complex with vitamin C tablet Take 1 tablet by mouth daily.   fish oil-omega-3 fatty acids 1000 MG capsule Take 1 g by mouth daily.   latanoprost 0.005 % ophthalmic solution Commonly known as:  XALATAN Place 1 drop into both eyes at bedtime.   levothyroxine 125 MCG tablet Commonly known as:  SYNTHROID, LEVOTHROID Take 1 tablet (125 mcg total) by mouth daily.   losartan-hydrochlorothiazide 100-25 MG tablet Commonly known as:  HYZAAR Take 1 tablet by mouth daily.   metoprolol succinate 25 MG 24 hr tablet Commonly known as:  TOPROL-XL Take 25 mg by mouth daily.   montelukast 10 MG tablet Commonly known as:  SINGULAIR Take one tablet once  daily.   pantoprazole 40 MG tablet Commonly known as:  PROTONIX Take 40 mg by mouth every other day.   RHINOCORT ALLERGY 32 MCG/ACT nasal spray Generic drug:  budesonide Place 1 spray into both nostrils daily.   sertraline 50 MG tablet Commonly known as:  ZOLOFT Take 50 mg by mouth daily.   Vitamin D 1000 units capsule Take 1,000 Units by mouth daily.       Past Medical History:  Diagnosis Date  . Anxiety   . GERD (gastroesophageal reflux disease)   . Glaucoma   . Hyperlipidemia   . Hypertension   . Macular degeneration   . Osteopenia   . Thyroid disease     Past Surgical History:  Procedure Laterality Date  . APPENDECTOMY    . CESAREAN SECTION    . FOREARM SURGERY      Allergies  Allergen Reactions  . Ropinirole Hydrochloride Other (See Comments)    Crazy feeling  . Sulfacetamide Sodium Other (See Comments)    Bad reaction many years ago - doesn't remember details  . Penicillins Rash    Review of systems negative except as noted in HPI / PMHx or noted below:  Review of Systems  Constitutional: Negative.   HENT: Negative.   Eyes: Negative.   Respiratory: Negative.   Cardiovascular: Negative.   Gastrointestinal: Negative.   Genitourinary: Negative.   Musculoskeletal: Negative.   Skin: Negative.   Neurological: Negative.   Endo/Heme/Allergies: Negative.   Psychiatric/Behavioral: Negative.  Objective:   Vitals:   05/19/16 1455  BP: 120/70  Pulse: 60  Resp: 18          Physical Exam  Constitutional: She is well-developed, well-nourished, and in no distress.  Coughing  HENT:  Head: Normocephalic.  Right Ear: Tympanic membrane, external ear and ear canal normal.  Left Ear: Tympanic membrane, external ear and ear canal normal.  Nose: Nose normal. No mucosal edema or rhinorrhea.  Mouth/Throat: Uvula is midline, oropharynx is clear and moist and mucous membranes are normal. No oropharyngeal exudate.  Eyes: Conjunctivae are normal.    Neck: Trachea normal. No tracheal tenderness present. No tracheal deviation present. No thyromegaly present.  Cardiovascular: Normal rate, regular rhythm, S1 normal, S2 normal and normal heart sounds.   No murmur heard. Pulmonary/Chest: Breath sounds normal. No stridor. No respiratory distress. She has no wheezes. She has no rales.  Musculoskeletal: She exhibits no edema.  Lymphadenopathy:       Head (right side): No tonsillar adenopathy present.       Head (left side): No tonsillar adenopathy present.    She has no cervical adenopathy.  Neurological: She is alert. Gait normal.  Skin: No rash noted. She is not diaphoretic. No erythema. Nails show no clubbing.  Psychiatric: Mood and affect normal.    Diagnostics:    Spirometry was performed and demonstrated an FEV1 of 2.21 at 86 % of predicted.  The patient had an Asthma Control Test with the following results:  .    Assessment and Plan:   1. Asthma, mild intermittent, well-controlled   2. Allergic rhinoconjunctivitis   3. LPRD (laryngopharyngeal reflux disease)     1. Obtain a chest x-ray  2.  Azithromycin 500 mg once a day for 3 days  3. Protonix  twice a day plus OTC ranitidine 150 2 tablets in evening  4. Montelukast 10 mg one tablet once a day  5. OTC Rhinocort one spray each nostril one time per day  6. OTC Mucinex DM 2 tablets twice a day  7. OTC cetirizine 10 mg one tablet one time per day  8. If needed ProAir HFA 2 puffs every 4-6 hours  9. Call with update in 2 weeks - further evaluation?  [name] has a persistent cough in the face of pretty good therapy to address infectious disease although certainly with her previous Ceftin administration we did not cover the possibility of mycoplasma or pertussis which we will now do with the use of azithromycin. As well, I would like for her to be very aggressive about treating her reflux-induced respiratory disease and use of anti-inflammatory agents on a regular  basis. I am going to check a chest x-ray to make sure were not dealing with some new etiology contributing to her cough. I'll regroup with her pending the response to her medical therapy and results of her chest x-ray.  Laurette Schimke, MD [facility]

## 2016-05-21 ENCOUNTER — Ambulatory Visit
Admission: RE | Admit: 2016-05-21 | Discharge: 2016-05-21 | Disposition: A | Discharge status: Home / Self Care | Payer: Medicare HMO | Source: Ambulatory Visit | Attending: Allergy and Immunology | Admitting: Allergy and Immunology

## 2016-11-09 ENCOUNTER — Ambulatory Visit: Payer: Medicare HMO | Admitting: Allergy and Immunology

## 2016-11-09 ENCOUNTER — Encounter (INDEPENDENT_AMBULATORY_CARE_PROVIDER_SITE_OTHER): Payer: Self-pay

## 2016-11-09 ENCOUNTER — Ambulatory Visit (INDEPENDENT_AMBULATORY_CARE_PROVIDER_SITE_OTHER): Payer: Medicare HMO | Admitting: Allergy and Immunology

## 2016-11-09 ENCOUNTER — Encounter: Payer: Self-pay | Admitting: Allergy and Immunology

## 2016-11-09 VITALS — BP 110/70 | HR 80 | Resp 16

## 2016-11-09 DIAGNOSIS — J4520 Mild intermittent asthma, uncomplicated: Secondary | ICD-10-CM

## 2016-11-09 DIAGNOSIS — J3089 Other allergic rhinitis: Secondary | ICD-10-CM

## 2016-11-09 DIAGNOSIS — K219 Gastro-esophageal reflux disease without esophagitis: Secondary | ICD-10-CM | POA: Diagnosis not present

## 2016-11-09 MED ORDER — MONTELUKAST SODIUM 10 MG PO TABS: ORAL_TABLET | ORAL | 5 refills | Status: DC

## 2016-11-09 MED ORDER — PANTOPRAZOLE SODIUM 40 MG PO TBEC: DELAYED_RELEASE_TABLET | ORAL | 1 refills | Status: DC

## 2016-11-09 MED ORDER — METHYLPREDNISOLONE ACETATE 80 MG/ML IJ SUSP
80.0000 mg | Freq: Once | INTRAMUSCULAR | Status: AC
Start: 2016-11-09 — End: 2016-11-09
  Administered 2016-11-09: 80 mg via INTRAMUSCULAR

## 2016-11-09 NOTE — Progress Notes (Signed)
Follow-up Note  Referring Provider: Marden NobleGates, Robert, MD Primary Provider: Pearla DubonnetGATES,ROBERT NEVILL, MD Date of Office Visit: 11/09/2016  Subjective:   [name] (DOB: 05/04/1946) is a 71 y.o. female who [facility]2016 in re-evaluation of the following:  HPI: [name] presents to this clinic in evaluation of respiratory tract symptoms that started 10 days ago. At that point time she just felt bad in general and she had chills and then she had cough and she had ear fullness along with some slight yellow sputum production. She's better regarding feeling bad and chills but she still continues to cough. She's not had any chest pain. She does not have a tremendous amount of shortness of breath. She was in the emergency room as a visitor 2 days prior to this issue and had exposure to lots of people coughing. When [name] does well with her respiratory tract issue and resolves over acute symptoms she usually ends up stopping all anti-inflammatory medications for her respiratory tract. Likewise, she ends up tapering off her ranitidine once her reflux gets under control.  Prior to this point time she was doing wonderful and really had no issues involving her throat or her reflux or her asthma and rarely uses a short acting bronchodilator and did not require either an antibiotic or a steroid.  She did receive the flu vaccine this year.  Allergies as of 11/09/2016      Reactions   Ropinirole Hydrochloride Other (See Comments)   Crazy feeling   Sulfacetamide Sodium Other (See Comments)   Bad reaction many years ago - doesn't remember details   Penicillins Rash      Medication List      albuterol 108 (90 Base) MCG/ACT inhaler Commonly known as:  PROAIR HFA Inhale two puffs every four to six hours as needed for cough or wheeze.   amLODipine 10 MG tablet Commonly known as:  NORVASC Take 10 mg by mouth daily.   aspirin EC 81 MG tablet Take 81 mg by mouth  daily.   B-complex with vitamin C tablet Take 1 tablet by mouth daily.   fish oil-omega-3 fatty acids 1000 MG capsule Take 1 g by mouth daily.   latanoprost 0.005 % ophthalmic solution Commonly known as:  XALATAN Place 1 drop into both eyes at bedtime.   levothyroxine 125 MCG tablet Commonly known as:  SYNTHROID, LEVOTHROID Take 1 tablet (125 mcg total) by mouth daily.   losartan-hydrochlorothiazide 100-25 MG tablet Commonly known as:  HYZAAR Take 1 tablet by mouth daily.   metoprolol succinate 25 MG 24 hr tablet Commonly known as:  TOPROL-XL Take 25 mg by mouth daily.   pantoprazole 40 MG tablet Commonly known as:  PROTONIX Take 40 mg by mouth every other day.   RHINOCORT ALLERGY 32 MCG/ACT nasal spray Generic drug:  budesonide Place 1 spray into both nostrils daily.   sertraline 50 MG tablet Commonly known as:  ZOLOFT Take 50 mg by mouth daily.   Vitamin D 1000 units capsule Take 1,000 Units by mouth daily.       Past Medical History:  Diagnosis Date  . Anxiety   . GERD (gastroesophageal reflux disease)   . Glaucoma   . Hyperlipidemia   . Hypertension   . Macular degeneration   . Osteopenia   . Thyroid disease     Past Surgical History:  Procedure Laterality Date  . APPENDECTOMY    . CESAREAN SECTION    .  FOREARM SURGERY      Review of systems negative except as noted in HPI / PMHx or noted below:  Review of Systems  Constitutional: Negative.   HENT: Negative.   Eyes: Negative.   Respiratory: Negative.   Cardiovascular: Negative.   Gastrointestinal: Negative.   Genitourinary: Negative.   Musculoskeletal: Negative.   Skin: Negative.   Neurological: Negative.   Endo/Heme/Allergies: Negative.   Psychiatric/Behavioral: Negative.      Objective:   Vitals:   11/09/16 1516  BP: 110/70  Pulse: 80  Resp: 16          Physical Exam  Constitutional: She is well-developed, well-nourished, and in no distress.  Coughing  HENT:  Head:  Normocephalic.  Right Ear: Tympanic membrane, external ear and ear canal normal.  Left Ear: Tympanic membrane, external ear and ear canal normal.  Nose: Nose normal. No mucosal edema or rhinorrhea.  Mouth/Throat: Uvula is midline, oropharynx is clear and moist and mucous membranes are normal. No oropharyngeal exudate.  Eyes: Conjunctivae are normal.  Neck: Trachea normal. No tracheal tenderness present. No tracheal deviation present. No thyromegaly present.  Cardiovascular: Normal rate, regular rhythm, S1 normal, S2 normal and normal heart sounds.   No murmur heard. Pulmonary/Chest: Breath sounds normal. No stridor. No respiratory distress. She has no wheezes. She has no rales.  Musculoskeletal: She exhibits no edema.  Lymphadenopathy:       Head (right side): No tonsillar adenopathy present.       Head (left side): No tonsillar adenopathy present.    She has no cervical adenopathy.  Neurological: She is alert. Gait normal.  Skin: No rash noted. She is not diaphoretic. No erythema. Nails show no clubbing.  Psychiatric: Mood and affect normal.    Diagnostics:    Spirometry was performed and demonstrated an FEV1 of 2.30 at 89 % of predicted.  Assessment and Plan:   1. Asthma, mild intermittent, well-controlled   2. Other allergic rhinitis   3. LPRD (laryngopharyngeal reflux disease)     1. Depomedrol 80 IM delivered in clinic today  2. Protonix 40mg  twice a day plus OTC ranitidine 150 2 tablets in evening  3. Montelukast 10 mg one tablet once a day  4. OTC Rhinocort one spray each nostril one time per day  5. OTC Mucinex DM 2 tablets twice a day  6. OTC cetirizine 10 mg one tablet one time per day  7. If needed ProAir HFA 2 puffs every 4-6 hours  8. further evaluation?  9. Return to clinic in 1 year or earlier if problem  Lizzet appears to have developed some type of viral respiratory tract infection that is given rise to significant inflammation of her respiratory  tract and I've encouraged her to utilize a plan of therapy including aggressively treating her reflux by reintroducing her ranitidine to Protonix and using some anti-inflammatory agents including a systemic steroid. She'll contact me noting her response and if she does well I will see her back in this clinic in 1 year or earlier if there is a problem.  Laurette Schimke, MD [facility]

## 2016-11-09 NOTE — Patient Instructions (Addendum)
   1. Depomedrol 80 IM delivered in clinic today  2. Protonix 40mg  twice a day plus OTC ranitidine 150 2 tablets in evening  3. Montelukast 10 mg one tablet once a day  4. OTC Rhinocort one spray each nostril one time per day  5. OTC Mucinex DM 2 tablets twice a day  6. OTC cetirizine 10 mg one tablet one time per day  7. If needed ProAir HFA 2 puffs every 4-6 hours  8. further evaluation?  9. Return to clinic in 1 year or earlier if problem

## 2016-11-12 ENCOUNTER — Encounter: Payer: Self-pay | Admitting: *Deleted

## 2016-11-29 DIAGNOSIS — H2513 Age-related nuclear cataract, bilateral: Secondary | ICD-10-CM | POA: Diagnosis not present

## 2016-11-29 DIAGNOSIS — H2512 Age-related nuclear cataract, left eye: Secondary | ICD-10-CM | POA: Diagnosis not present

## 2016-11-29 DIAGNOSIS — H401223 Low-tension glaucoma, left eye, severe stage: Secondary | ICD-10-CM | POA: Diagnosis not present

## 2016-11-29 DIAGNOSIS — H401211 Low-tension glaucoma, right eye, mild stage: Secondary | ICD-10-CM | POA: Diagnosis not present

## 2016-11-29 DIAGNOSIS — H25013 Cortical age-related cataract, bilateral: Secondary | ICD-10-CM | POA: Diagnosis not present

## 2016-12-01 DIAGNOSIS — R69 Illness, unspecified: Secondary | ICD-10-CM | POA: Diagnosis not present

## 2016-12-06 DIAGNOSIS — J309 Allergic rhinitis, unspecified: Secondary | ICD-10-CM | POA: Diagnosis not present

## 2016-12-06 DIAGNOSIS — J4520 Mild intermittent asthma, uncomplicated: Secondary | ICD-10-CM | POA: Diagnosis not present

## 2016-12-15 DIAGNOSIS — E039 Hypothyroidism, unspecified: Secondary | ICD-10-CM | POA: Diagnosis not present

## 2016-12-15 DIAGNOSIS — J309 Allergic rhinitis, unspecified: Secondary | ICD-10-CM | POA: Diagnosis not present

## 2016-12-15 DIAGNOSIS — E669 Obesity, unspecified: Secondary | ICD-10-CM | POA: Diagnosis not present

## 2016-12-15 DIAGNOSIS — J4520 Mild intermittent asthma, uncomplicated: Secondary | ICD-10-CM | POA: Diagnosis not present

## 2016-12-15 DIAGNOSIS — R69 Illness, unspecified: Secondary | ICD-10-CM | POA: Diagnosis not present

## 2016-12-15 DIAGNOSIS — E785 Hyperlipidemia, unspecified: Secondary | ICD-10-CM | POA: Diagnosis not present

## 2016-12-15 DIAGNOSIS — Z1239 Encounter for other screening for malignant neoplasm of breast: Secondary | ICD-10-CM | POA: Diagnosis not present

## 2016-12-15 DIAGNOSIS — Z1389 Encounter for screening for other disorder: Secondary | ICD-10-CM | POA: Diagnosis not present

## 2016-12-15 DIAGNOSIS — N390 Urinary tract infection, site not specified: Secondary | ICD-10-CM | POA: Diagnosis not present

## 2016-12-15 DIAGNOSIS — K219 Gastro-esophageal reflux disease without esophagitis: Secondary | ICD-10-CM | POA: Diagnosis not present

## 2016-12-15 DIAGNOSIS — E559 Vitamin D deficiency, unspecified: Secondary | ICD-10-CM | POA: Diagnosis not present

## 2016-12-15 DIAGNOSIS — Z7189 Other specified counseling: Secondary | ICD-10-CM | POA: Diagnosis not present

## 2016-12-15 DIAGNOSIS — Z0000 Encounter for general adult medical examination without abnormal findings: Secondary | ICD-10-CM | POA: Diagnosis not present

## 2016-12-15 DIAGNOSIS — I10 Essential (primary) hypertension: Secondary | ICD-10-CM | POA: Diagnosis not present

## 2017-01-11 DIAGNOSIS — H25812 Combined forms of age-related cataract, left eye: Secondary | ICD-10-CM | POA: Diagnosis not present

## 2017-01-11 DIAGNOSIS — H2512 Age-related nuclear cataract, left eye: Secondary | ICD-10-CM | POA: Diagnosis not present

## 2017-01-31 DIAGNOSIS — H2511 Age-related nuclear cataract, right eye: Secondary | ICD-10-CM | POA: Diagnosis not present

## 2017-01-31 DIAGNOSIS — H25011 Cortical age-related cataract, right eye: Secondary | ICD-10-CM | POA: Diagnosis not present

## 2017-02-08 DIAGNOSIS — H2511 Age-related nuclear cataract, right eye: Secondary | ICD-10-CM | POA: Diagnosis not present

## 2017-02-08 DIAGNOSIS — H25811 Combined forms of age-related cataract, right eye: Secondary | ICD-10-CM | POA: Diagnosis not present

## 2017-04-21 IMAGING — DX DG ANKLE COMPLETE 3+V*L*
3 series · 3 of 3 positions shown · non-contrast
Comparison: None.

CLINICAL DATA: Stepped off pavement onto grass and fell. Left
lateral ankle swelling and pain. Initial encounter.

EXAM:
LEFT ANKLE COMPLETE - 3+ VIEW

[ankle ap]
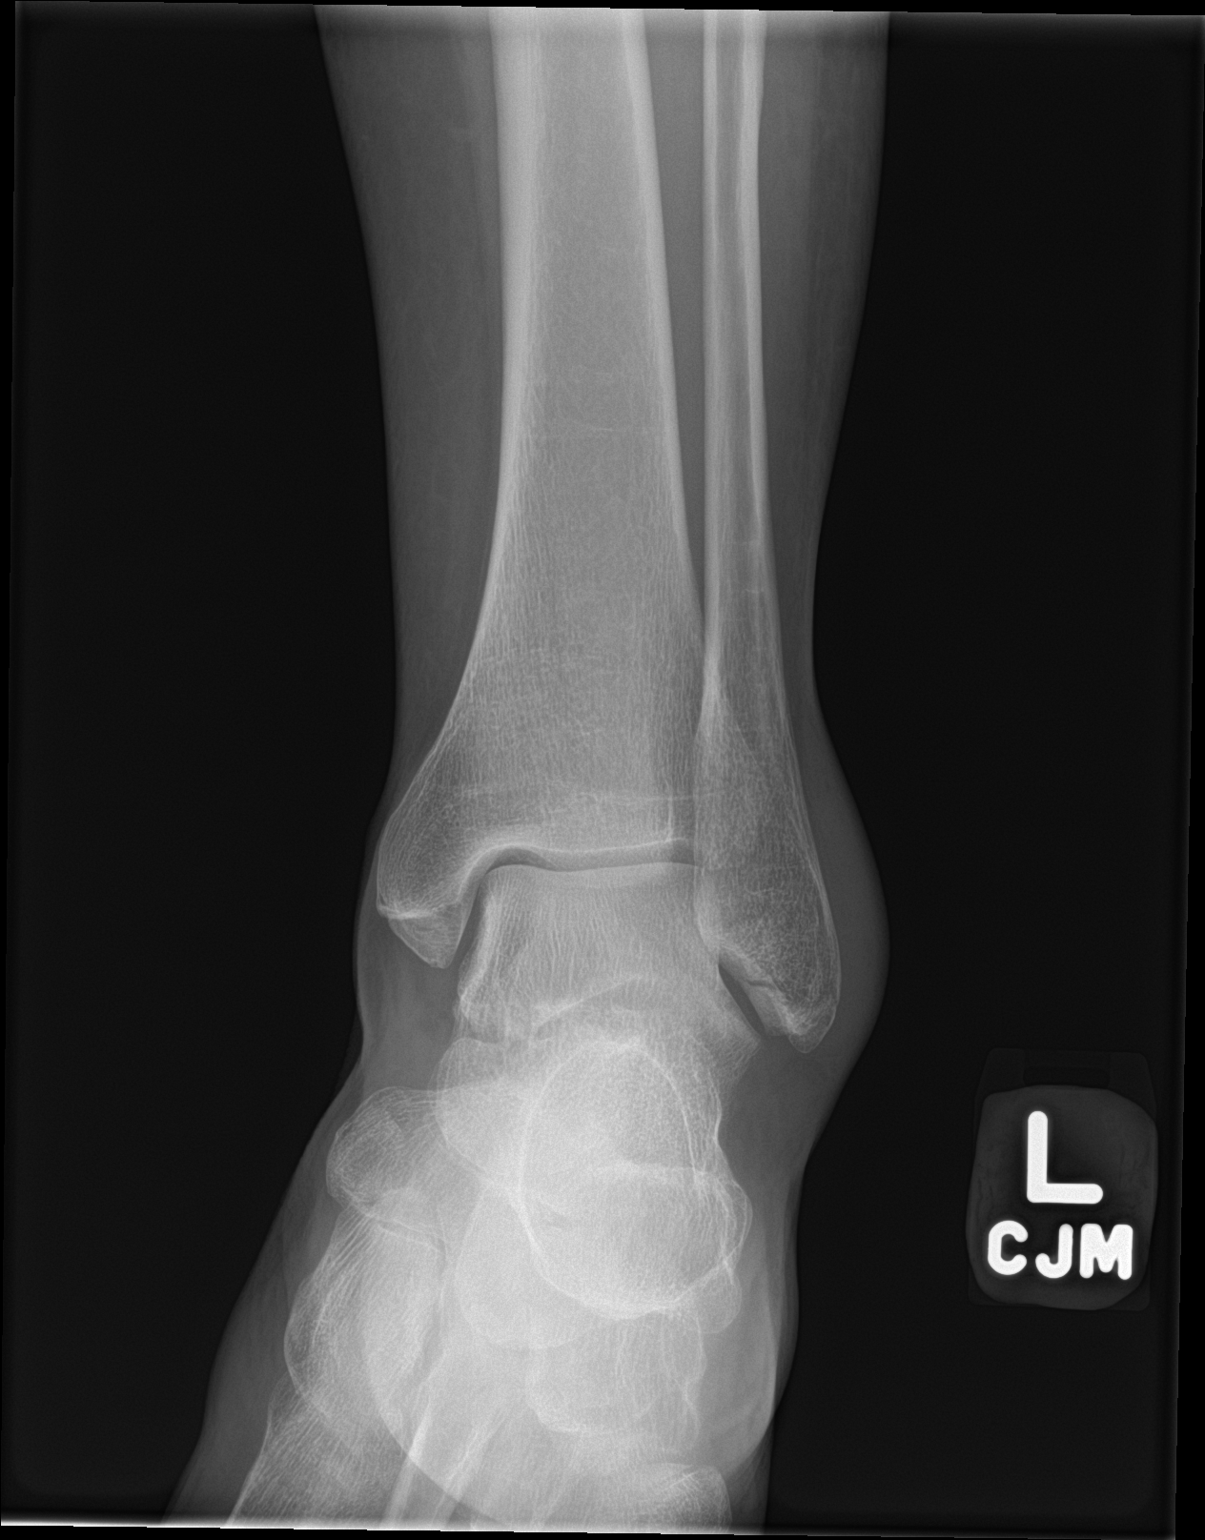

[ankle obl]
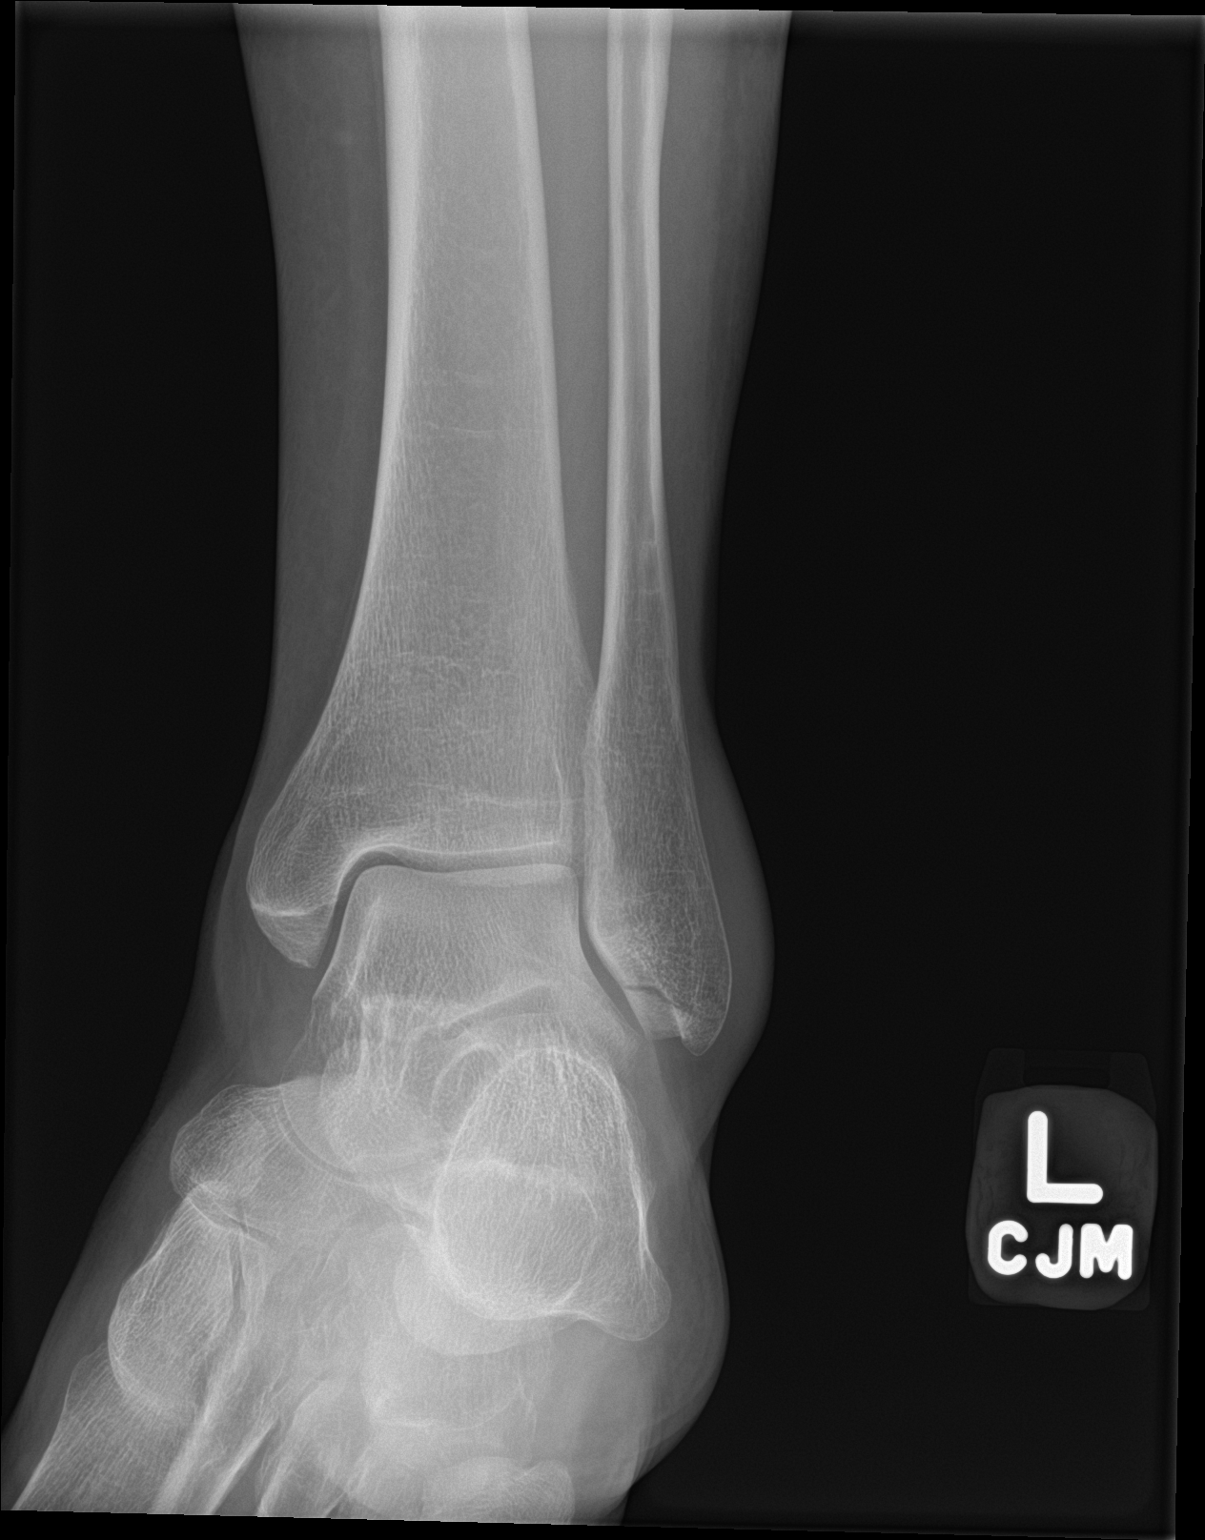

[ankle lat]
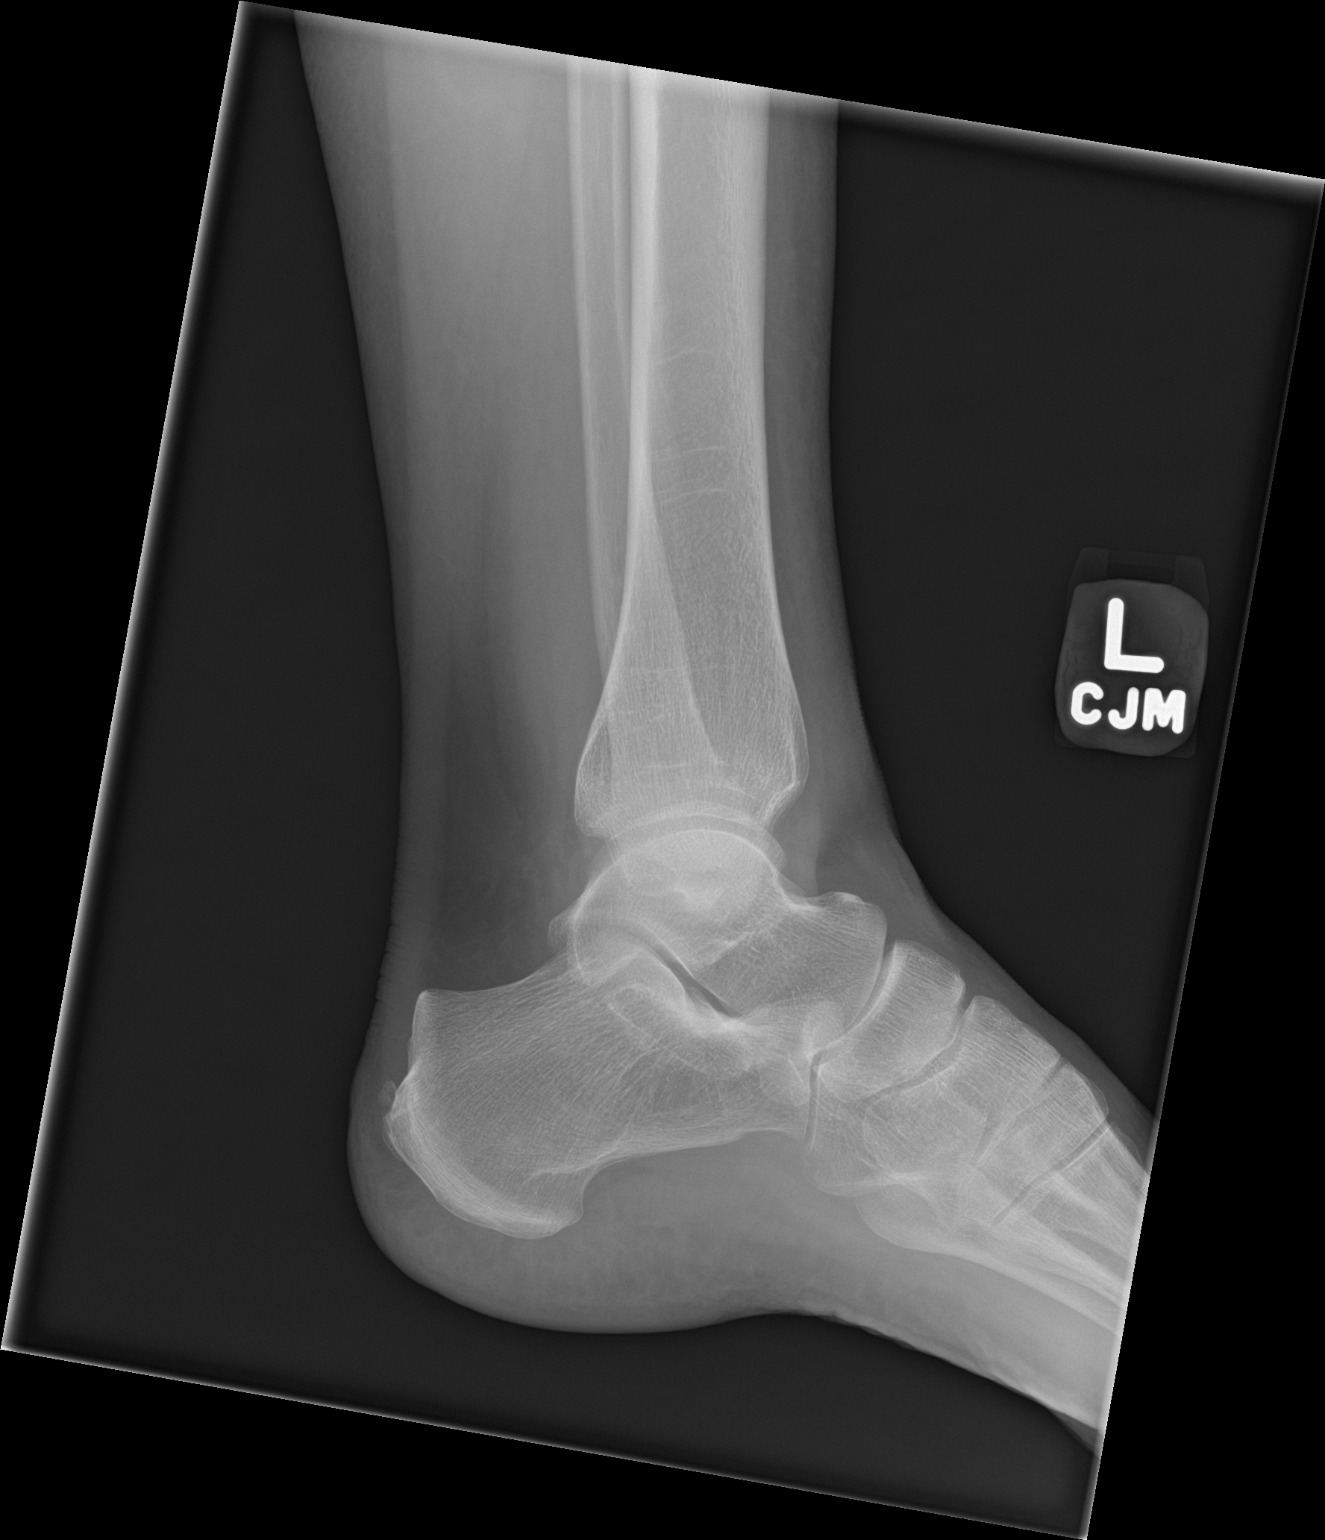

[3 of 3 positions shown; findings below may reference images not displayed]

FINDINGS: There is a minimally displaced fracture at the distal fibula, with
overlying soft tissue swelling. The ankle mortise is intact; the
interosseous space is within normal limits. No talar tilt or
subluxation is seen. A small posterior calcaneal spur is seen.

The joint spaces are preserved.
IMPRESSION: Minimally displaced fracture of the distal fibula, with overlying
soft tissue swelling.

## 2017-04-21 IMAGING — DX DG ELBOW COMPLETE 3+V*R*
4 series · 4 of 4 positions shown · non-contrast
Comparison: None.

CLINICAL DATA: Stepped off pavement onto grass and fell. Right
elbow pain. Initial encounter.

EXAM:
RIGHT ELBOW - COMPLETE 3+ VIEW

[elbow ap]
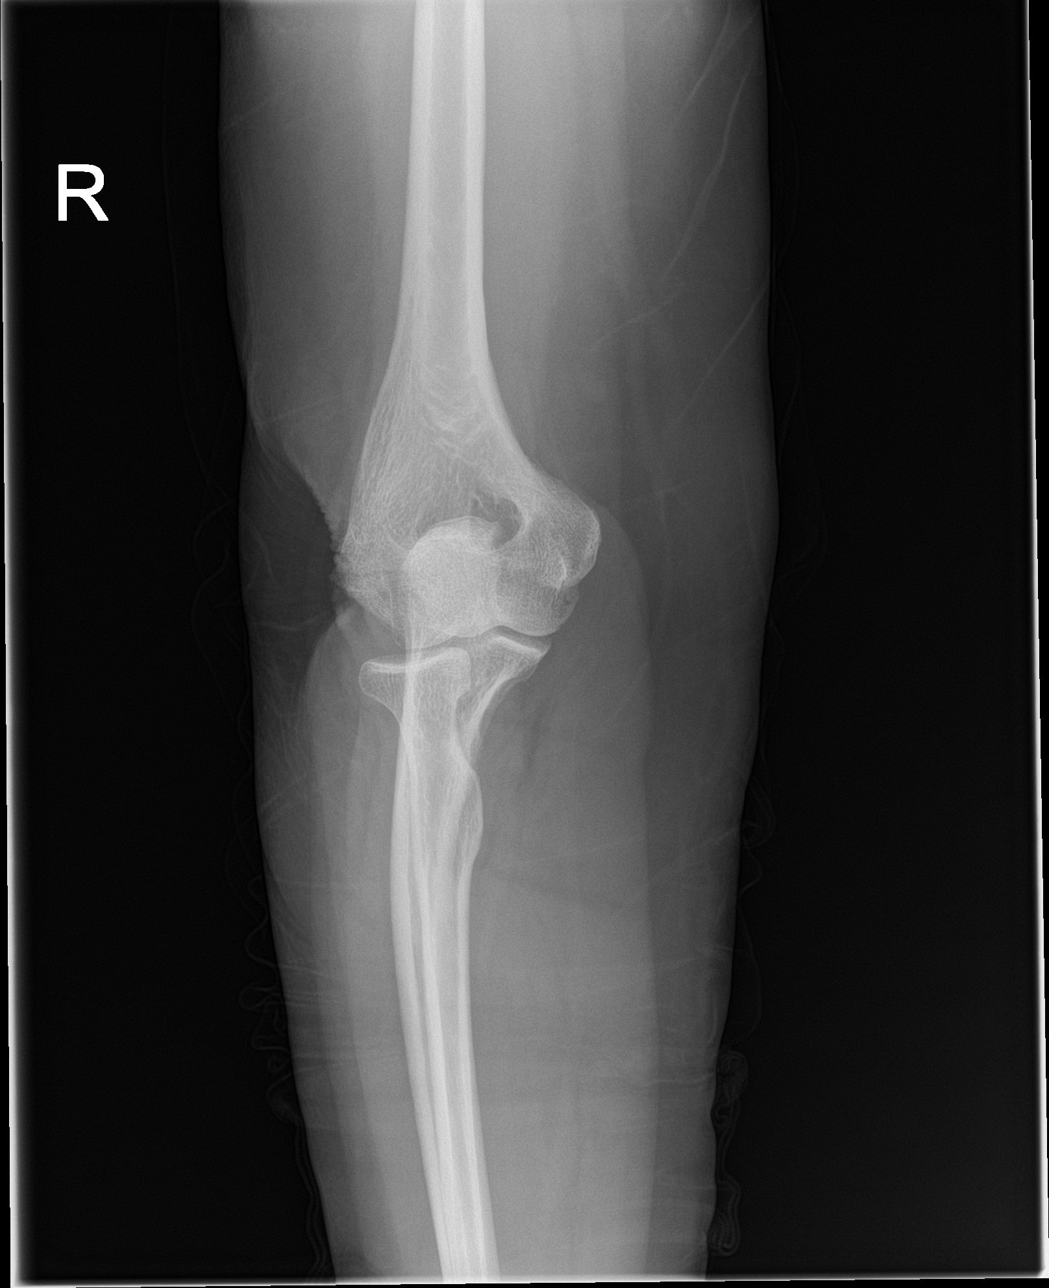

[elbow obl (1 of 2)]
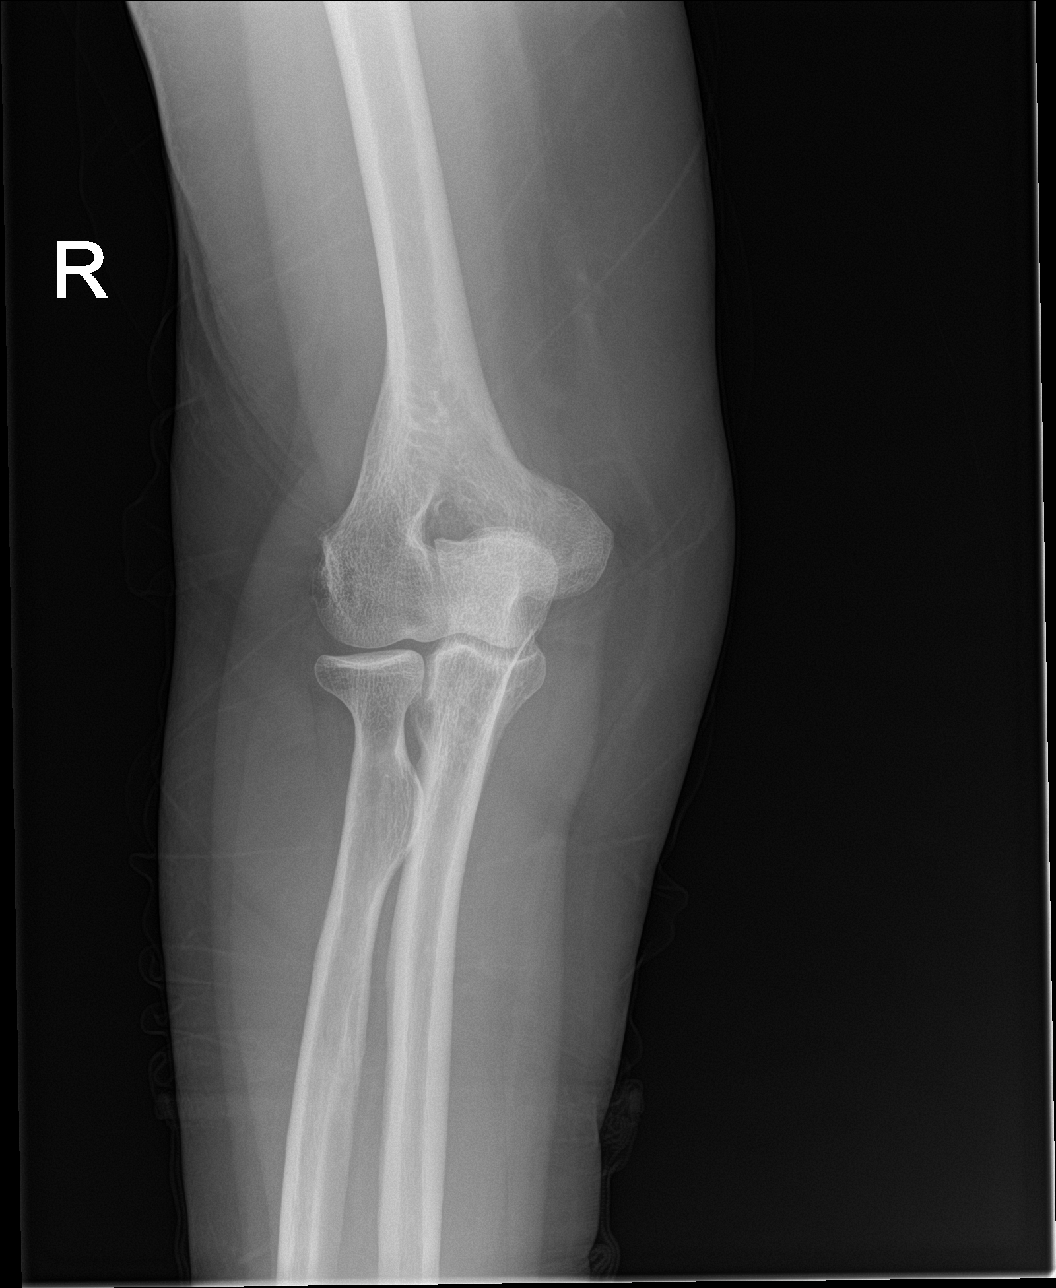

[elbow obl (2 of 2)]
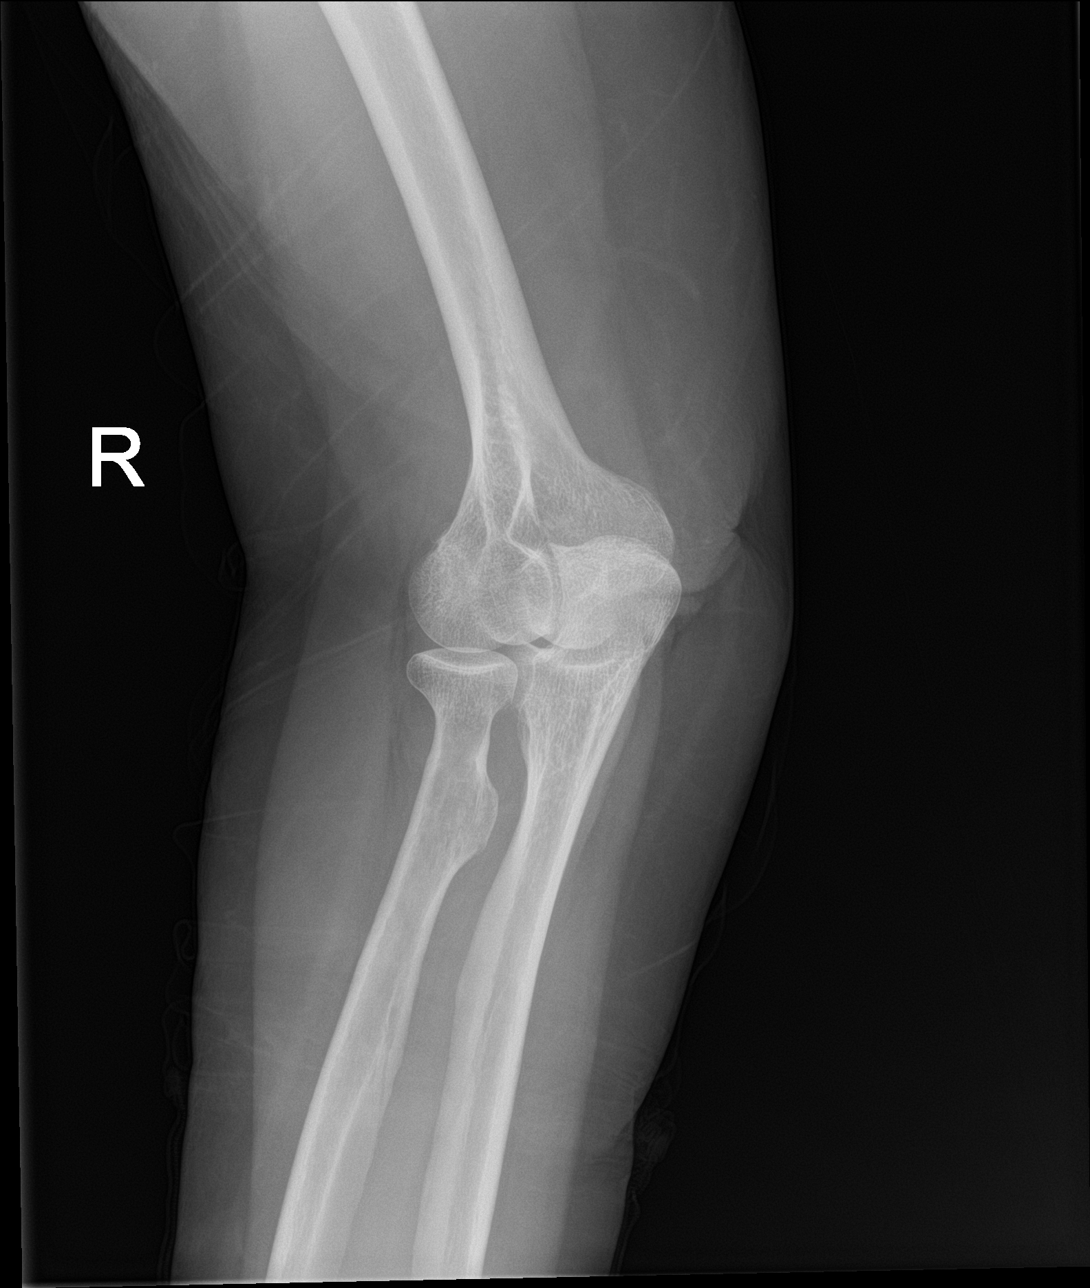

[elbow lat]
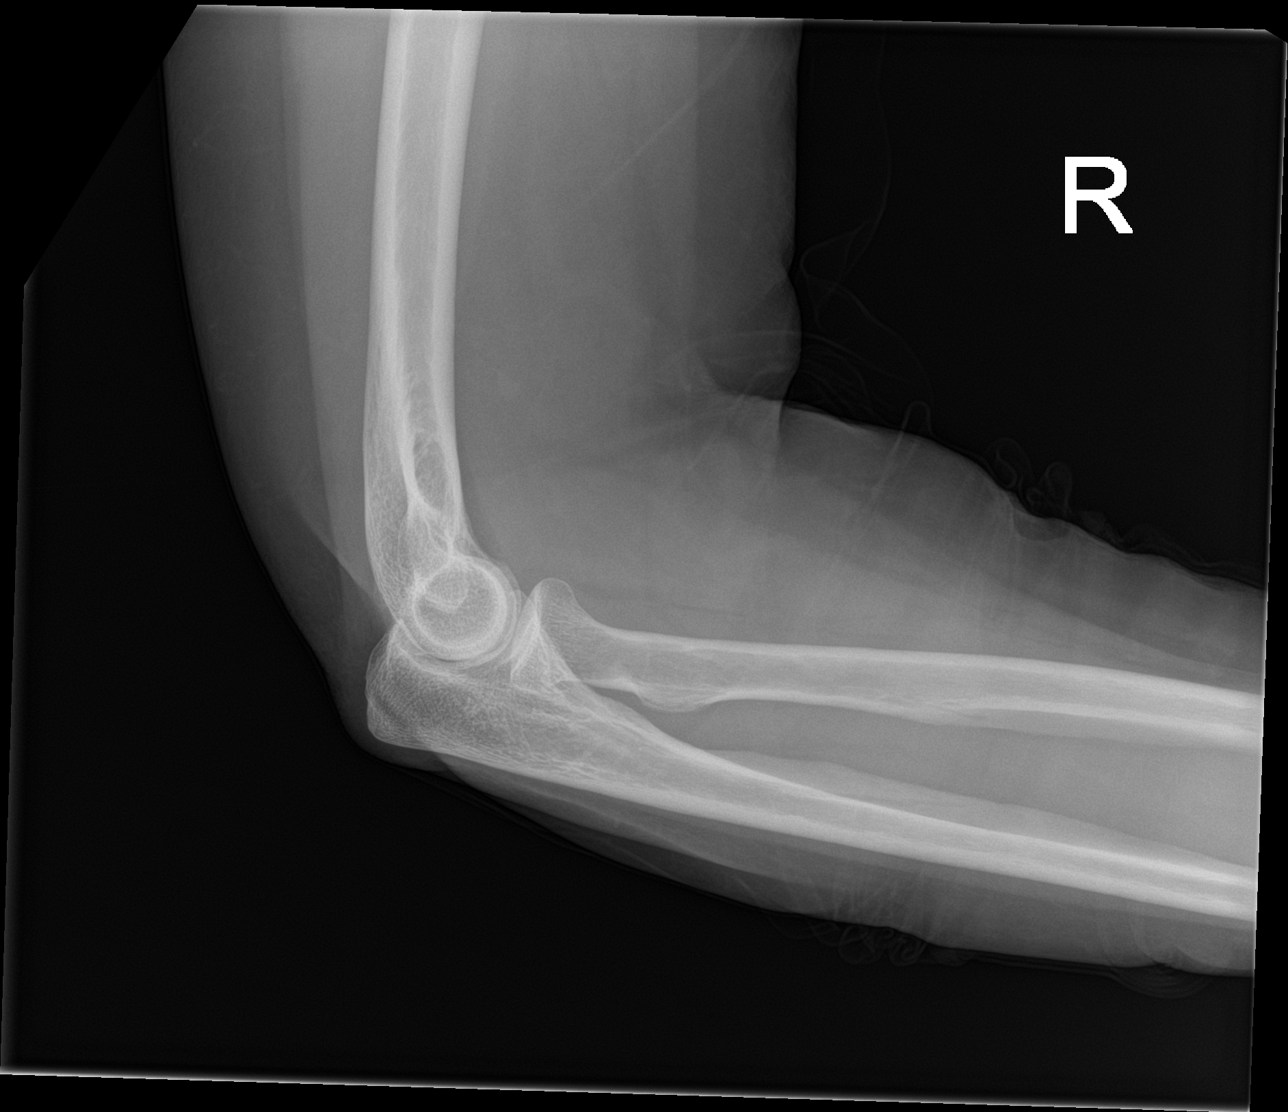

[4 of 4 positions shown; findings below may reference images not displayed]

FINDINGS: There is no evidence of fracture or dislocation. The visualized
joint spaces are preserved. No significant joint effusion is
identified. The soft tissues are unremarkable in appearance.
IMPRESSION: No evidence of fracture or dislocation.

## 2017-04-21 IMAGING — DX DG WRIST COMPLETE 3+V*R*
3 series · 3 of 3 positions shown · non-contrast
Comparison: None.

CLINICAL DATA: Stepped off pavement onto grass and fell. Right
wrist pain and deformity. Initial encounter.

EXAM:
RIGHT WRIST - COMPLETE 3+ VIEW

[wrist pa]
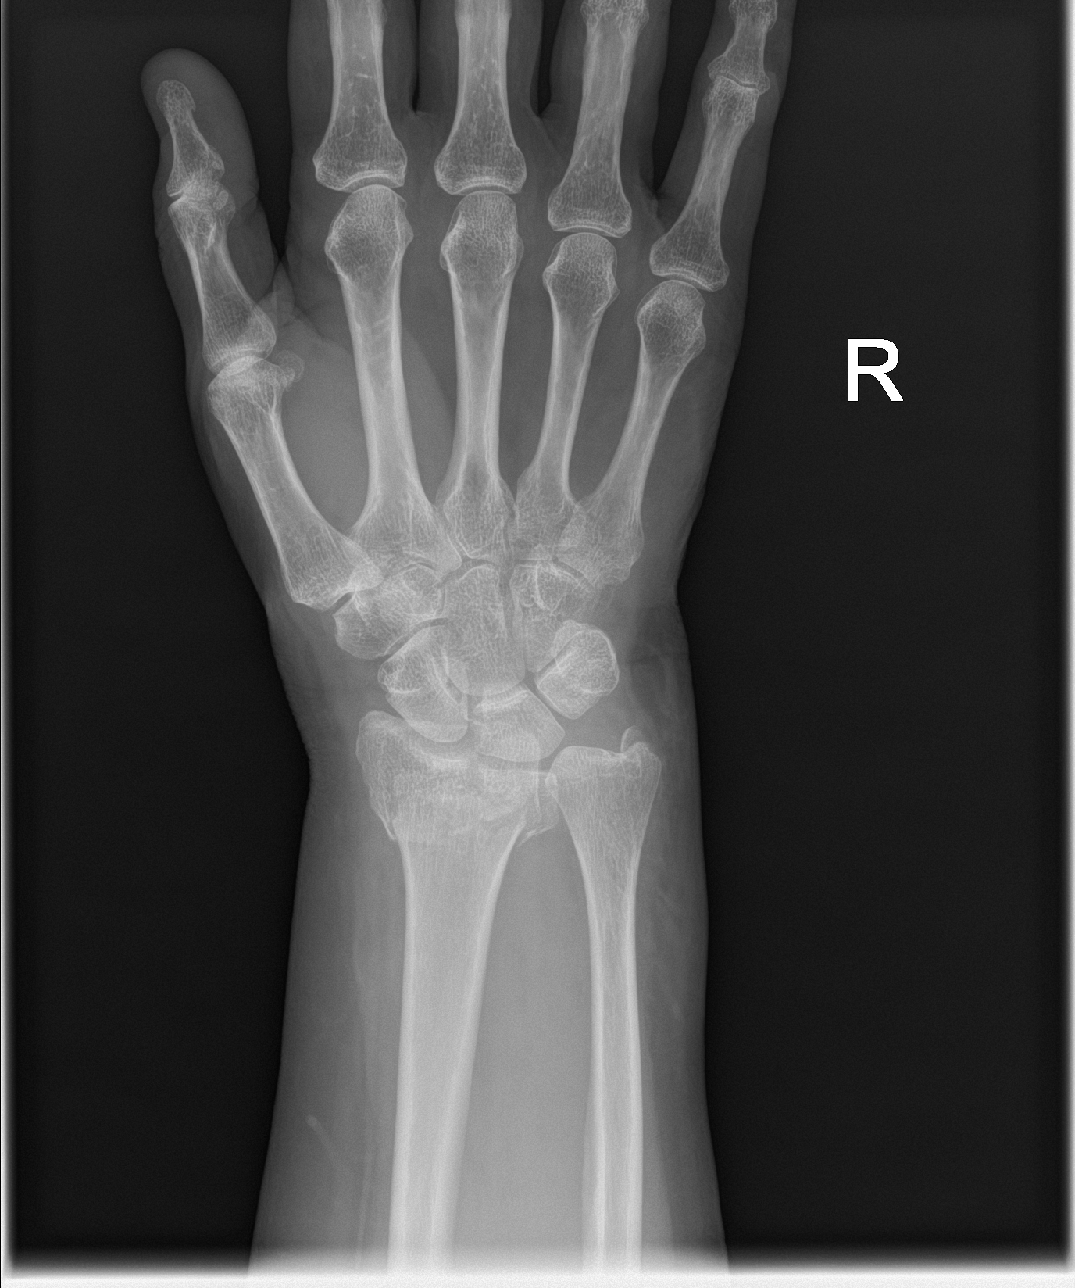

[wrist obl]
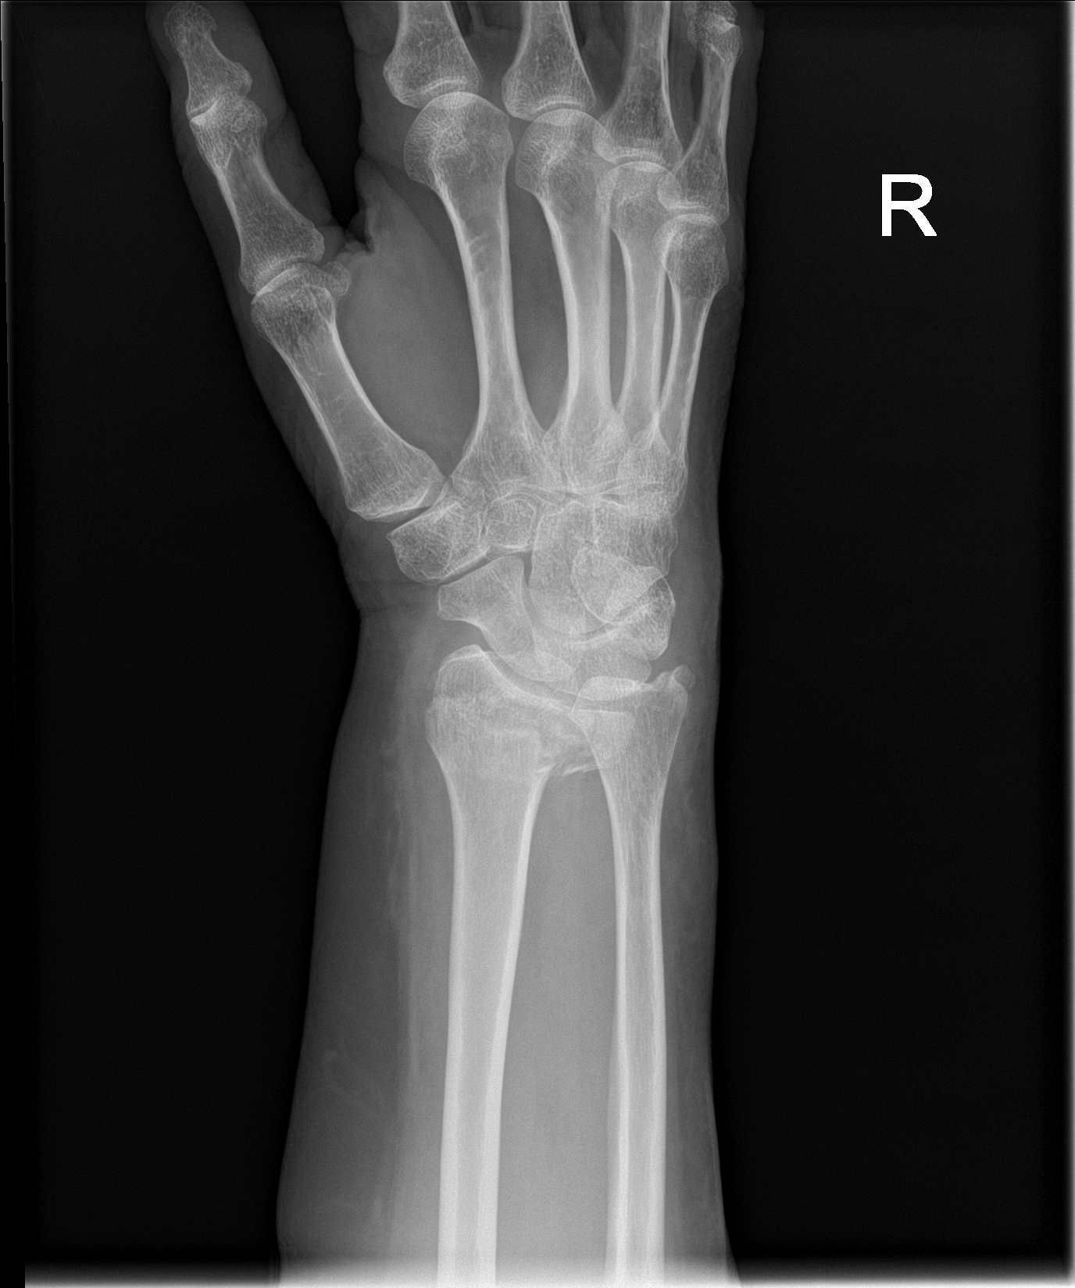

[wrist lat]
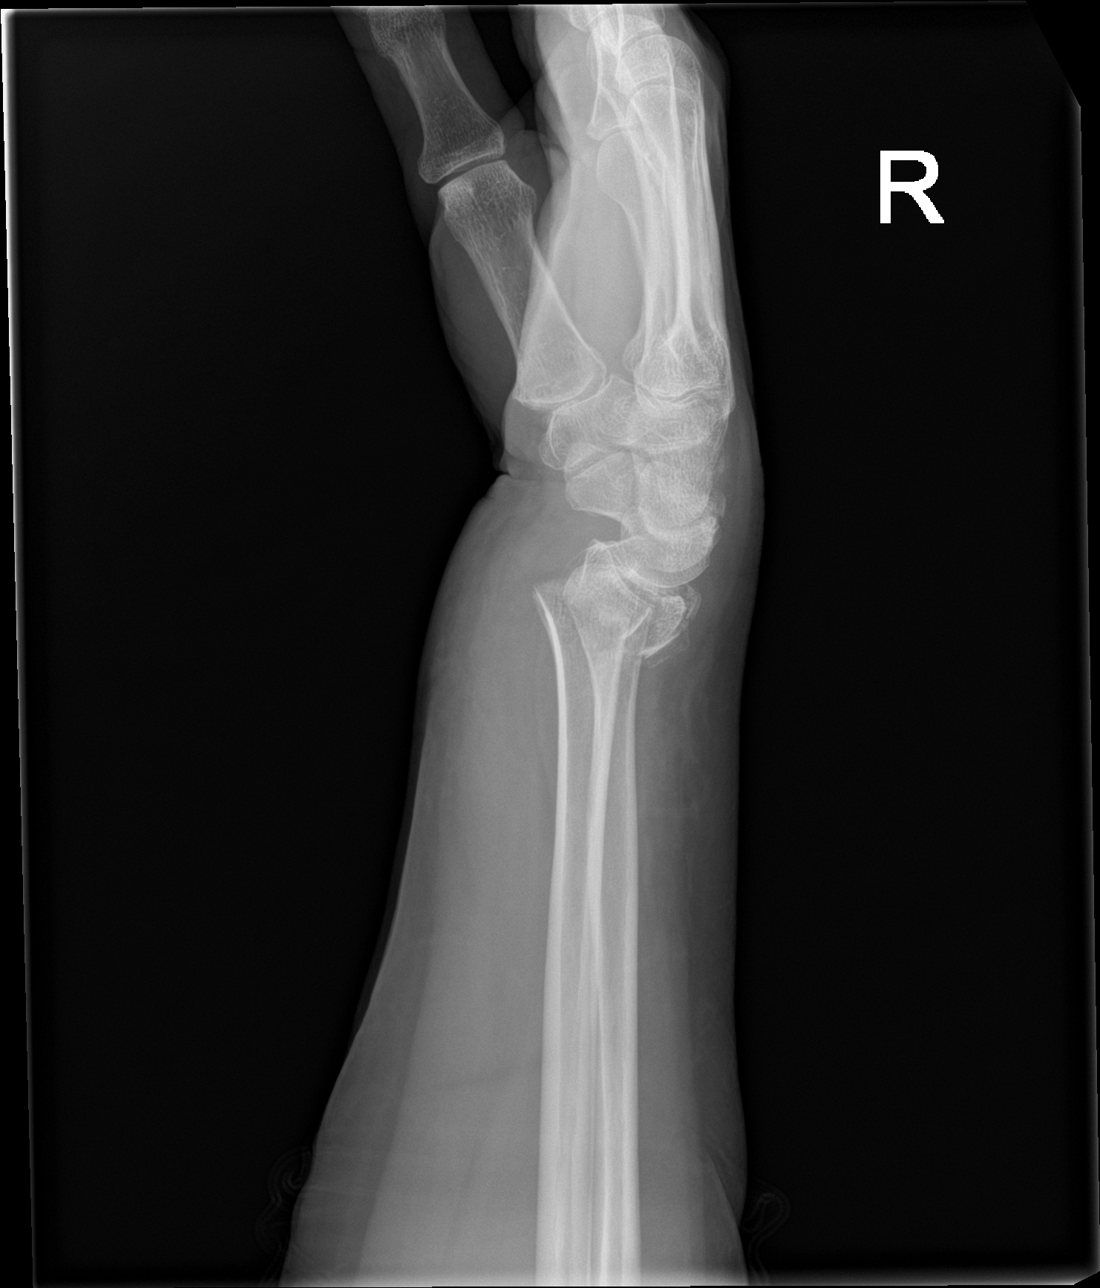

[3 of 3 positions shown; findings below may reference images not displayed]

FINDINGS: There is a comminuted and impacted fracture at the distal radial
metaphysis, with dorsal angulation and displacement. A mildly
displaced ulnar styloid fracture is noted.

The carpal rows articulate with the distal fragments. Soft tissue
swelling is noted about the fracture site.
IMPRESSION: Comminuted and impacted fracture of the distal radial metaphysis,
with dorsal angulation and displacement. Mildly displaced ulnar
styloid fracture noted.

## 2017-04-21 IMAGING — DX DG RIBS W/ CHEST 3+V*R*
3 series · 3 of 3 positions shown · non-contrast
Comparison: Chest radiograph performed 06/26/2012

CLINICAL DATA: Stepped off pavement onto grass and fell. Concern
for chest injury. Initial encounter.

EXAM:
RIGHT RIBS AND CHEST - 3+ VIEW

[chest ap]
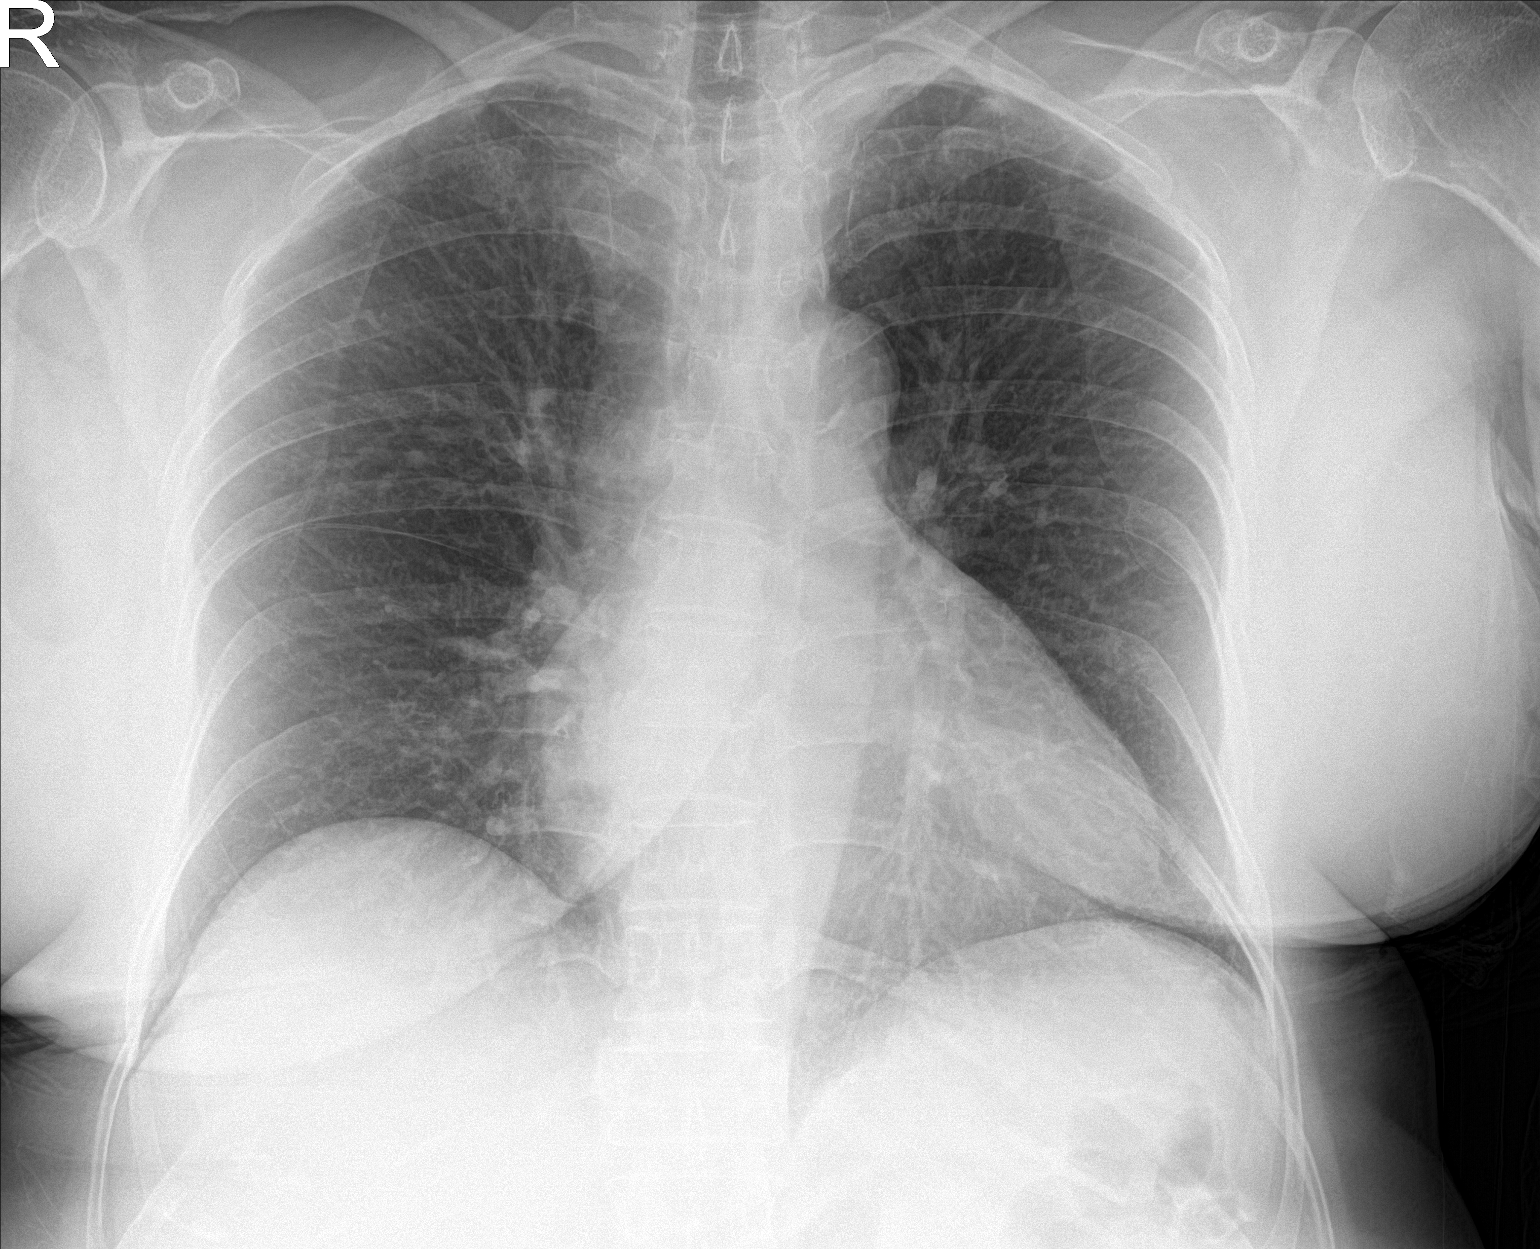

[rib ap]
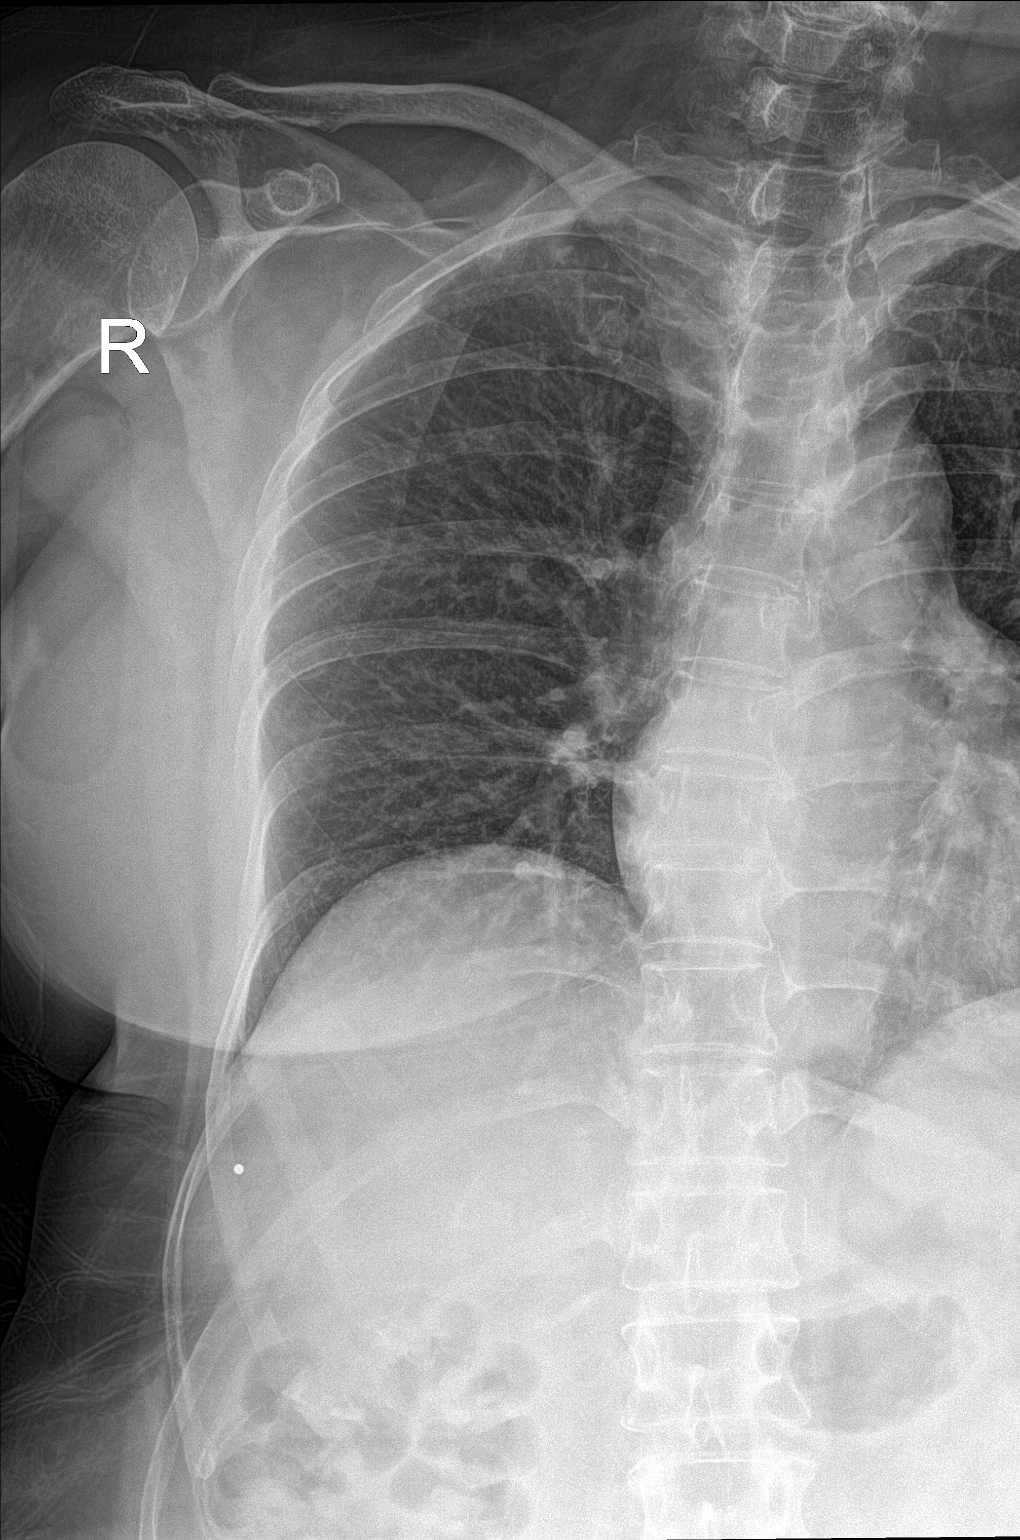

[rib ap obl]
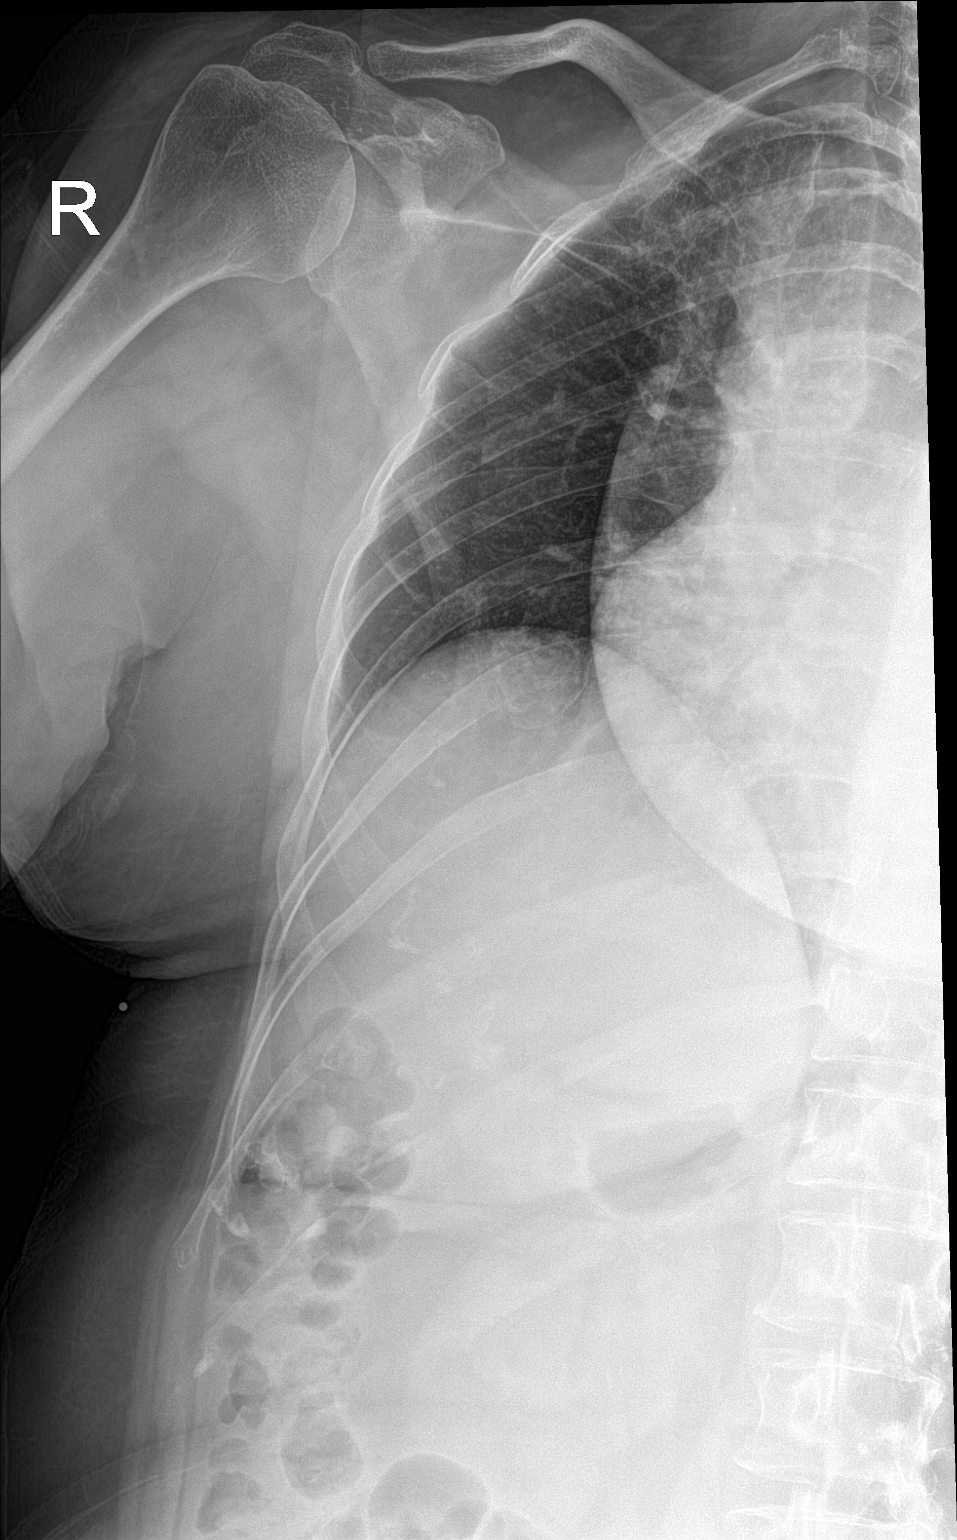

[3 of 3 positions shown; findings below may reference images not displayed]

FINDINGS: No displaced rib fractures are seen.

The lungs are well-aerated and clear. There is no evidence of focal
opacification, pleural effusion or pneumothorax.

The cardiomediastinal silhouette is mildly enlarged. No acute
osseous abnormalities are seen.
IMPRESSION: 1. No displaced rib fracture seen.
2. Mild cardiomegaly.  Lungs remain grossly clear.

## 2017-06-15 DIAGNOSIS — R0902 Hypoxemia: Secondary | ICD-10-CM | POA: Diagnosis not present

## 2017-06-15 DIAGNOSIS — G2581 Restless legs syndrome: Secondary | ICD-10-CM | POA: Diagnosis not present

## 2017-06-23 DIAGNOSIS — H01003 Unspecified blepharitis right eye, unspecified eyelid: Secondary | ICD-10-CM | POA: Diagnosis not present

## 2017-06-23 DIAGNOSIS — H1852 Epithelial (juvenile) corneal dystrophy: Secondary | ICD-10-CM | POA: Diagnosis not present

## 2017-06-23 DIAGNOSIS — H401211 Low-tension glaucoma, right eye, mild stage: Secondary | ICD-10-CM | POA: Diagnosis not present

## 2017-06-23 DIAGNOSIS — H401222 Low-tension glaucoma, left eye, moderate stage: Secondary | ICD-10-CM | POA: Diagnosis not present

## 2017-07-12 DIAGNOSIS — R69 Illness, unspecified: Secondary | ICD-10-CM | POA: Diagnosis not present

## 2017-08-02 DIAGNOSIS — R69 Illness, unspecified: Secondary | ICD-10-CM | POA: Diagnosis not present

## 2017-08-21 DIAGNOSIS — Z1211 Encounter for screening for malignant neoplasm of colon: Secondary | ICD-10-CM | POA: Diagnosis not present

## 2017-08-22 DIAGNOSIS — G2581 Restless legs syndrome: Secondary | ICD-10-CM | POA: Diagnosis not present

## 2017-08-22 DIAGNOSIS — R5383 Other fatigue: Secondary | ICD-10-CM | POA: Diagnosis not present

## 2017-08-24 DIAGNOSIS — G4733 Obstructive sleep apnea (adult) (pediatric): Secondary | ICD-10-CM | POA: Diagnosis not present

## 2017-08-24 DIAGNOSIS — G2581 Restless legs syndrome: Secondary | ICD-10-CM | POA: Diagnosis not present

## 2017-09-13 DIAGNOSIS — G4733 Obstructive sleep apnea (adult) (pediatric): Secondary | ICD-10-CM | POA: Diagnosis not present

## 2017-10-13 DIAGNOSIS — G4733 Obstructive sleep apnea (adult) (pediatric): Secondary | ICD-10-CM | POA: Diagnosis not present

## 2017-11-13 DIAGNOSIS — G4733 Obstructive sleep apnea (adult) (pediatric): Secondary | ICD-10-CM | POA: Diagnosis not present

## 2017-11-14 DIAGNOSIS — J209 Acute bronchitis, unspecified: Secondary | ICD-10-CM | POA: Diagnosis not present

## 2017-11-28 DIAGNOSIS — G2581 Restless legs syndrome: Secondary | ICD-10-CM | POA: Diagnosis not present

## 2017-11-28 DIAGNOSIS — G4733 Obstructive sleep apnea (adult) (pediatric): Secondary | ICD-10-CM | POA: Diagnosis not present

## 2017-12-08 DIAGNOSIS — H353132 Nonexudative age-related macular degeneration, bilateral, intermediate dry stage: Secondary | ICD-10-CM | POA: Diagnosis not present

## 2017-12-08 DIAGNOSIS — H35033 Hypertensive retinopathy, bilateral: Secondary | ICD-10-CM | POA: Diagnosis not present

## 2017-12-08 DIAGNOSIS — H401211 Low-tension glaucoma, right eye, mild stage: Secondary | ICD-10-CM | POA: Diagnosis not present

## 2017-12-08 DIAGNOSIS — H401222 Low-tension glaucoma, left eye, moderate stage: Secondary | ICD-10-CM | POA: Diagnosis not present

## 2017-12-14 DIAGNOSIS — G4733 Obstructive sleep apnea (adult) (pediatric): Secondary | ICD-10-CM | POA: Diagnosis not present

## 2017-12-20 DIAGNOSIS — Z7189 Other specified counseling: Secondary | ICD-10-CM | POA: Diagnosis not present

## 2017-12-20 DIAGNOSIS — G2589 Other specified extrapyramidal and movement disorders: Secondary | ICD-10-CM | POA: Diagnosis not present

## 2017-12-20 DIAGNOSIS — Z0000 Encounter for general adult medical examination without abnormal findings: Secondary | ICD-10-CM | POA: Diagnosis not present

## 2017-12-20 DIAGNOSIS — H35039 Hypertensive retinopathy, unspecified eye: Secondary | ICD-10-CM | POA: Diagnosis not present

## 2017-12-20 DIAGNOSIS — E039 Hypothyroidism, unspecified: Secondary | ICD-10-CM | POA: Diagnosis not present

## 2017-12-20 DIAGNOSIS — E785 Hyperlipidemia, unspecified: Secondary | ICD-10-CM | POA: Diagnosis not present

## 2017-12-20 DIAGNOSIS — J309 Allergic rhinitis, unspecified: Secondary | ICD-10-CM | POA: Diagnosis not present

## 2017-12-20 DIAGNOSIS — I10 Essential (primary) hypertension: Secondary | ICD-10-CM | POA: Diagnosis not present

## 2017-12-20 DIAGNOSIS — K219 Gastro-esophageal reflux disease without esophagitis: Secondary | ICD-10-CM | POA: Diagnosis not present

## 2017-12-20 DIAGNOSIS — G4733 Obstructive sleep apnea (adult) (pediatric): Secondary | ICD-10-CM | POA: Diagnosis not present

## 2017-12-20 DIAGNOSIS — R69 Illness, unspecified: Secondary | ICD-10-CM | POA: Diagnosis not present

## 2017-12-20 DIAGNOSIS — Z1389 Encounter for screening for other disorder: Secondary | ICD-10-CM | POA: Diagnosis not present

## 2017-12-20 DIAGNOSIS — E559 Vitamin D deficiency, unspecified: Secondary | ICD-10-CM | POA: Diagnosis not present

## 2018-01-11 DIAGNOSIS — G4733 Obstructive sleep apnea (adult) (pediatric): Secondary | ICD-10-CM | POA: Diagnosis not present

## 2018-02-11 DIAGNOSIS — G4733 Obstructive sleep apnea (adult) (pediatric): Secondary | ICD-10-CM | POA: Diagnosis not present

## 2018-02-16 DIAGNOSIS — R69 Illness, unspecified: Secondary | ICD-10-CM | POA: Diagnosis not present

## 2018-03-13 DIAGNOSIS — G4733 Obstructive sleep apnea (adult) (pediatric): Secondary | ICD-10-CM | POA: Diagnosis not present

## 2018-04-13 DIAGNOSIS — G4733 Obstructive sleep apnea (adult) (pediatric): Secondary | ICD-10-CM | POA: Diagnosis not present

## 2018-05-08 DIAGNOSIS — R69 Illness, unspecified: Secondary | ICD-10-CM | POA: Diagnosis not present

## 2018-05-08 DIAGNOSIS — I739 Peripheral vascular disease, unspecified: Secondary | ICD-10-CM | POA: Diagnosis not present

## 2018-05-08 DIAGNOSIS — I10 Essential (primary) hypertension: Secondary | ICD-10-CM | POA: Diagnosis not present

## 2018-05-13 DIAGNOSIS — G4733 Obstructive sleep apnea (adult) (pediatric): Secondary | ICD-10-CM | POA: Diagnosis not present

## 2018-06-13 DIAGNOSIS — G4733 Obstructive sleep apnea (adult) (pediatric): Secondary | ICD-10-CM | POA: Diagnosis not present

## 2018-06-26 DIAGNOSIS — H40053 Ocular hypertension, bilateral: Secondary | ICD-10-CM | POA: Diagnosis not present

## 2018-06-26 DIAGNOSIS — H5340 Unspecified visual field defects: Secondary | ICD-10-CM | POA: Diagnosis not present

## 2018-06-26 DIAGNOSIS — H401222 Low-tension glaucoma, left eye, moderate stage: Secondary | ICD-10-CM | POA: Diagnosis not present

## 2018-06-26 DIAGNOSIS — H401211 Low-tension glaucoma, right eye, mild stage: Secondary | ICD-10-CM | POA: Diagnosis not present

## 2018-06-28 ENCOUNTER — Other Ambulatory Visit: Payer: Self-pay | Admitting: Ophthalmology

## 2018-06-28 DIAGNOSIS — H5340 Unspecified visual field defects: Secondary | ICD-10-CM

## 2018-07-10 DIAGNOSIS — G4733 Obstructive sleep apnea (adult) (pediatric): Secondary | ICD-10-CM | POA: Diagnosis not present

## 2018-07-12 ENCOUNTER — Ambulatory Visit
Admission: RE | Admit: 2018-07-12 | Discharge: 2018-07-12 | Disposition: A | Discharge status: Home / Self Care | Payer: Medicare HMO | Source: Ambulatory Visit | Attending: Ophthalmology | Admitting: Ophthalmology

## 2018-07-12 DIAGNOSIS — H5340 Unspecified visual field defects: Secondary | ICD-10-CM

## 2018-07-12 DIAGNOSIS — H538 Other visual disturbances: Secondary | ICD-10-CM | POA: Diagnosis not present

## 2018-07-12 MED ORDER — GADOBENATE DIMEGLUMINE 529 MG/ML IV SOLN
18.0000 mL | Freq: Once | INTRAVENOUS | Status: AC | PRN
  Administered 2018-07-12: 18 mL via INTRAVENOUS

## 2018-07-14 DIAGNOSIS — G4733 Obstructive sleep apnea (adult) (pediatric): Secondary | ICD-10-CM | POA: Diagnosis not present

## 2018-07-24 DIAGNOSIS — I10 Essential (primary) hypertension: Secondary | ICD-10-CM | POA: Diagnosis not present

## 2018-07-24 DIAGNOSIS — I739 Peripheral vascular disease, unspecified: Secondary | ICD-10-CM | POA: Diagnosis not present

## 2018-07-24 DIAGNOSIS — Z23 Encounter for immunization: Secondary | ICD-10-CM | POA: Diagnosis not present

## 2018-07-24 DIAGNOSIS — R69 Illness, unspecified: Secondary | ICD-10-CM | POA: Diagnosis not present

## 2018-08-22 DIAGNOSIS — R69 Illness, unspecified: Secondary | ICD-10-CM | POA: Diagnosis not present

## 2018-09-25 DIAGNOSIS — E039 Hypothyroidism, unspecified: Secondary | ICD-10-CM | POA: Diagnosis not present

## 2018-09-26 DIAGNOSIS — R69 Illness, unspecified: Secondary | ICD-10-CM | POA: Diagnosis not present

## 2018-09-26 DIAGNOSIS — R531 Weakness: Secondary | ICD-10-CM | POA: Diagnosis not present

## 2018-09-26 DIAGNOSIS — Z9181 History of falling: Secondary | ICD-10-CM | POA: Diagnosis not present

## 2018-10-17 DIAGNOSIS — J40 Bronchitis, not specified as acute or chronic: Secondary | ICD-10-CM | POA: Diagnosis not present

## 2018-10-17 DIAGNOSIS — I10 Essential (primary) hypertension: Secondary | ICD-10-CM | POA: Diagnosis not present

## 2018-10-19 DIAGNOSIS — H401122 Primary open-angle glaucoma, left eye, moderate stage: Secondary | ICD-10-CM | POA: Diagnosis not present

## 2018-10-19 DIAGNOSIS — H26491 Other secondary cataract, right eye: Secondary | ICD-10-CM | POA: Diagnosis not present

## 2018-10-19 DIAGNOSIS — H401111 Primary open-angle glaucoma, right eye, mild stage: Secondary | ICD-10-CM | POA: Diagnosis not present

## 2018-10-19 DIAGNOSIS — H353132 Nonexudative age-related macular degeneration, bilateral, intermediate dry stage: Secondary | ICD-10-CM | POA: Diagnosis not present

## 2018-10-19 DIAGNOSIS — H26493 Other secondary cataract, bilateral: Secondary | ICD-10-CM | POA: Diagnosis not present

## 2018-11-27 DIAGNOSIS — H26492 Other secondary cataract, left eye: Secondary | ICD-10-CM | POA: Diagnosis not present

## 2018-12-07 DIAGNOSIS — R69 Illness, unspecified: Secondary | ICD-10-CM | POA: Diagnosis not present

## 2018-12-14 DIAGNOSIS — M3501 Sicca syndrome with keratoconjunctivitis: Secondary | ICD-10-CM | POA: Diagnosis not present

## 2018-12-14 DIAGNOSIS — H401122 Primary open-angle glaucoma, left eye, moderate stage: Secondary | ICD-10-CM | POA: Diagnosis not present

## 2018-12-14 DIAGNOSIS — H401111 Primary open-angle glaucoma, right eye, mild stage: Secondary | ICD-10-CM | POA: Diagnosis not present

## 2018-12-14 DIAGNOSIS — H5340 Unspecified visual field defects: Secondary | ICD-10-CM | POA: Diagnosis not present

## 2019-01-08 DIAGNOSIS — J209 Acute bronchitis, unspecified: Secondary | ICD-10-CM | POA: Diagnosis not present

## 2019-03-13 DIAGNOSIS — Z882 Allergy status to sulfonamides status: Secondary | ICD-10-CM | POA: Diagnosis not present

## 2019-03-13 DIAGNOSIS — I10 Essential (primary) hypertension: Secondary | ICD-10-CM | POA: Diagnosis not present

## 2019-03-13 DIAGNOSIS — F419 Anxiety disorder, unspecified: Secondary | ICD-10-CM | POA: Diagnosis not present

## 2019-03-13 DIAGNOSIS — E669 Obesity, unspecified: Secondary | ICD-10-CM | POA: Diagnosis not present

## 2019-03-13 DIAGNOSIS — Z8249 Family history of ischemic heart disease and other diseases of the circulatory system: Secondary | ICD-10-CM | POA: Diagnosis not present

## 2019-03-13 DIAGNOSIS — R69 Illness, unspecified: Secondary | ICD-10-CM | POA: Diagnosis not present

## 2019-03-13 DIAGNOSIS — Z7982 Long term (current) use of aspirin: Secondary | ICD-10-CM | POA: Diagnosis not present

## 2019-03-13 DIAGNOSIS — E039 Hypothyroidism, unspecified: Secondary | ICD-10-CM | POA: Diagnosis not present

## 2019-03-13 DIAGNOSIS — G4730 Sleep apnea, unspecified: Secondary | ICD-10-CM | POA: Diagnosis not present

## 2019-03-13 DIAGNOSIS — Z809 Family history of malignant neoplasm, unspecified: Secondary | ICD-10-CM | POA: Diagnosis not present

## 2019-03-19 DIAGNOSIS — H401111 Primary open-angle glaucoma, right eye, mild stage: Secondary | ICD-10-CM | POA: Diagnosis not present

## 2019-04-05 DIAGNOSIS — H401122 Primary open-angle glaucoma, left eye, moderate stage: Secondary | ICD-10-CM | POA: Diagnosis not present

## 2019-05-14 DIAGNOSIS — R69 Illness, unspecified: Secondary | ICD-10-CM | POA: Diagnosis not present

## 2019-05-29 ENCOUNTER — Other Ambulatory Visit: Payer: Self-pay | Admitting: Internal Medicine

## 2019-05-29 DIAGNOSIS — R69 Illness, unspecified: Secondary | ICD-10-CM | POA: Diagnosis not present

## 2019-05-29 DIAGNOSIS — E611 Iron deficiency: Secondary | ICD-10-CM | POA: Diagnosis not present

## 2019-05-29 DIAGNOSIS — G4733 Obstructive sleep apnea (adult) (pediatric): Secondary | ICD-10-CM | POA: Diagnosis not present

## 2019-05-29 DIAGNOSIS — J309 Allergic rhinitis, unspecified: Secondary | ICD-10-CM | POA: Diagnosis not present

## 2019-05-29 DIAGNOSIS — H35039 Hypertensive retinopathy, unspecified eye: Secondary | ICD-10-CM | POA: Diagnosis not present

## 2019-05-29 DIAGNOSIS — I10 Essential (primary) hypertension: Secondary | ICD-10-CM | POA: Diagnosis not present

## 2019-05-29 DIAGNOSIS — E559 Vitamin D deficiency, unspecified: Secondary | ICD-10-CM | POA: Diagnosis not present

## 2019-05-29 DIAGNOSIS — Z1389 Encounter for screening for other disorder: Secondary | ICD-10-CM | POA: Diagnosis not present

## 2019-05-29 DIAGNOSIS — E039 Hypothyroidism, unspecified: Secondary | ICD-10-CM | POA: Diagnosis not present

## 2019-05-29 DIAGNOSIS — I739 Peripheral vascular disease, unspecified: Secondary | ICD-10-CM | POA: Diagnosis not present

## 2019-05-29 DIAGNOSIS — Z0001 Encounter for general adult medical examination with abnormal findings: Secondary | ICD-10-CM | POA: Diagnosis not present

## 2019-05-29 DIAGNOSIS — K219 Gastro-esophageal reflux disease without esophagitis: Secondary | ICD-10-CM | POA: Diagnosis not present

## 2019-05-29 DIAGNOSIS — Z1231 Encounter for screening mammogram for malignant neoplasm of breast: Secondary | ICD-10-CM

## 2019-06-07 DIAGNOSIS — H35033 Hypertensive retinopathy, bilateral: Secondary | ICD-10-CM | POA: Diagnosis not present

## 2019-06-07 DIAGNOSIS — H401122 Primary open-angle glaucoma, left eye, moderate stage: Secondary | ICD-10-CM | POA: Diagnosis not present

## 2019-06-07 DIAGNOSIS — H401111 Primary open-angle glaucoma, right eye, mild stage: Secondary | ICD-10-CM | POA: Diagnosis not present

## 2019-06-07 DIAGNOSIS — H353132 Nonexudative age-related macular degeneration, bilateral, intermediate dry stage: Secondary | ICD-10-CM | POA: Diagnosis not present

## 2019-06-18 DIAGNOSIS — E785 Hyperlipidemia, unspecified: Secondary | ICD-10-CM | POA: Diagnosis not present

## 2019-06-18 DIAGNOSIS — H35039 Hypertensive retinopathy, unspecified eye: Secondary | ICD-10-CM | POA: Diagnosis not present

## 2019-06-18 DIAGNOSIS — R69 Illness, unspecified: Secondary | ICD-10-CM | POA: Diagnosis not present

## 2019-06-18 DIAGNOSIS — I10 Essential (primary) hypertension: Secondary | ICD-10-CM | POA: Diagnosis not present

## 2019-06-18 DIAGNOSIS — M1990 Unspecified osteoarthritis, unspecified site: Secondary | ICD-10-CM | POA: Diagnosis not present

## 2019-06-18 DIAGNOSIS — E039 Hypothyroidism, unspecified: Secondary | ICD-10-CM | POA: Diagnosis not present

## 2019-06-18 DIAGNOSIS — J4520 Mild intermittent asthma, uncomplicated: Secondary | ICD-10-CM | POA: Diagnosis not present

## 2019-06-18 DIAGNOSIS — J40 Bronchitis, not specified as acute or chronic: Secondary | ICD-10-CM | POA: Diagnosis not present

## 2019-06-19 DIAGNOSIS — R69 Illness, unspecified: Secondary | ICD-10-CM | POA: Diagnosis not present

## 2019-07-16 ENCOUNTER — Other Ambulatory Visit: Payer: Self-pay

## 2019-07-16 ENCOUNTER — Ambulatory Visit
Admission: RE | Admit: 2019-07-16 | Discharge: 2019-07-16 | Disposition: A | Discharge status: Home / Self Care | Payer: Medicare HMO | Source: Ambulatory Visit | Attending: Internal Medicine | Admitting: Internal Medicine

## 2019-07-16 DIAGNOSIS — Z1231 Encounter for screening mammogram for malignant neoplasm of breast: Secondary | ICD-10-CM

## 2019-09-05 DIAGNOSIS — H401122 Primary open-angle glaucoma, left eye, moderate stage: Secondary | ICD-10-CM | POA: Diagnosis not present

## 2019-09-05 DIAGNOSIS — R69 Illness, unspecified: Secondary | ICD-10-CM | POA: Diagnosis not present

## 2019-09-05 DIAGNOSIS — H401111 Primary open-angle glaucoma, right eye, mild stage: Secondary | ICD-10-CM | POA: Diagnosis not present

## 2019-09-14 DIAGNOSIS — R69 Illness, unspecified: Secondary | ICD-10-CM | POA: Diagnosis not present

## 2019-09-14 DIAGNOSIS — E039 Hypothyroidism, unspecified: Secondary | ICD-10-CM | POA: Diagnosis not present

## 2019-09-14 DIAGNOSIS — J40 Bronchitis, not specified as acute or chronic: Secondary | ICD-10-CM | POA: Diagnosis not present

## 2019-09-14 DIAGNOSIS — I10 Essential (primary) hypertension: Secondary | ICD-10-CM | POA: Diagnosis not present

## 2019-09-14 DIAGNOSIS — M1990 Unspecified osteoarthritis, unspecified site: Secondary | ICD-10-CM | POA: Diagnosis not present

## 2019-09-14 DIAGNOSIS — E785 Hyperlipidemia, unspecified: Secondary | ICD-10-CM | POA: Diagnosis not present

## 2019-09-14 DIAGNOSIS — J4520 Mild intermittent asthma, uncomplicated: Secondary | ICD-10-CM | POA: Diagnosis not present

## 2019-09-14 DIAGNOSIS — H35039 Hypertensive retinopathy, unspecified eye: Secondary | ICD-10-CM | POA: Diagnosis not present

## 2019-10-26 DIAGNOSIS — H35039 Hypertensive retinopathy, unspecified eye: Secondary | ICD-10-CM | POA: Diagnosis not present

## 2019-10-26 DIAGNOSIS — J40 Bronchitis, not specified as acute or chronic: Secondary | ICD-10-CM | POA: Diagnosis not present

## 2019-10-26 DIAGNOSIS — E785 Hyperlipidemia, unspecified: Secondary | ICD-10-CM | POA: Diagnosis not present

## 2019-10-26 DIAGNOSIS — R69 Illness, unspecified: Secondary | ICD-10-CM | POA: Diagnosis not present

## 2019-10-26 DIAGNOSIS — I10 Essential (primary) hypertension: Secondary | ICD-10-CM | POA: Diagnosis not present

## 2019-10-26 DIAGNOSIS — E039 Hypothyroidism, unspecified: Secondary | ICD-10-CM | POA: Diagnosis not present

## 2019-10-26 DIAGNOSIS — J4520 Mild intermittent asthma, uncomplicated: Secondary | ICD-10-CM | POA: Diagnosis not present

## 2019-10-26 DIAGNOSIS — M1990 Unspecified osteoarthritis, unspecified site: Secondary | ICD-10-CM | POA: Diagnosis not present

## 2019-11-07 DIAGNOSIS — M25562 Pain in left knee: Secondary | ICD-10-CM | POA: Diagnosis not present

## 2019-11-28 DIAGNOSIS — R69 Illness, unspecified: Secondary | ICD-10-CM | POA: Diagnosis not present

## 2019-11-29 DIAGNOSIS — M1990 Unspecified osteoarthritis, unspecified site: Secondary | ICD-10-CM | POA: Diagnosis not present

## 2019-11-29 DIAGNOSIS — M25562 Pain in left knee: Secondary | ICD-10-CM | POA: Diagnosis not present

## 2019-12-03 DIAGNOSIS — E039 Hypothyroidism, unspecified: Secondary | ICD-10-CM | POA: Diagnosis not present

## 2019-12-03 DIAGNOSIS — J40 Bronchitis, not specified as acute or chronic: Secondary | ICD-10-CM | POA: Diagnosis not present

## 2019-12-03 DIAGNOSIS — M1990 Unspecified osteoarthritis, unspecified site: Secondary | ICD-10-CM | POA: Diagnosis not present

## 2019-12-03 DIAGNOSIS — E785 Hyperlipidemia, unspecified: Secondary | ICD-10-CM | POA: Diagnosis not present

## 2019-12-03 DIAGNOSIS — J4520 Mild intermittent asthma, uncomplicated: Secondary | ICD-10-CM | POA: Diagnosis not present

## 2019-12-03 DIAGNOSIS — H35039 Hypertensive retinopathy, unspecified eye: Secondary | ICD-10-CM | POA: Diagnosis not present

## 2019-12-03 DIAGNOSIS — R69 Illness, unspecified: Secondary | ICD-10-CM | POA: Diagnosis not present

## 2019-12-03 DIAGNOSIS — I10 Essential (primary) hypertension: Secondary | ICD-10-CM | POA: Diagnosis not present

## 2019-12-13 DIAGNOSIS — H401122 Primary open-angle glaucoma, left eye, moderate stage: Secondary | ICD-10-CM | POA: Diagnosis not present

## 2019-12-13 DIAGNOSIS — H353132 Nonexudative age-related macular degeneration, bilateral, intermediate dry stage: Secondary | ICD-10-CM | POA: Diagnosis not present

## 2019-12-13 DIAGNOSIS — H401111 Primary open-angle glaucoma, right eye, mild stage: Secondary | ICD-10-CM | POA: Diagnosis not present

## 2019-12-13 DIAGNOSIS — H35033 Hypertensive retinopathy, bilateral: Secondary | ICD-10-CM | POA: Diagnosis not present

## 2019-12-28 DIAGNOSIS — E039 Hypothyroidism, unspecified: Secondary | ICD-10-CM | POA: Diagnosis not present

## 2019-12-28 DIAGNOSIS — R69 Illness, unspecified: Secondary | ICD-10-CM | POA: Diagnosis not present

## 2019-12-28 DIAGNOSIS — I10 Essential (primary) hypertension: Secondary | ICD-10-CM | POA: Diagnosis not present

## 2019-12-28 DIAGNOSIS — E669 Obesity, unspecified: Secondary | ICD-10-CM | POA: Diagnosis not present

## 2019-12-28 DIAGNOSIS — E785 Hyperlipidemia, unspecified: Secondary | ICD-10-CM | POA: Diagnosis not present

## 2019-12-28 DIAGNOSIS — J309 Allergic rhinitis, unspecified: Secondary | ICD-10-CM | POA: Diagnosis not present

## 2019-12-28 DIAGNOSIS — K219 Gastro-esophageal reflux disease without esophagitis: Secondary | ICD-10-CM | POA: Diagnosis not present

## 2020-01-21 DIAGNOSIS — M25562 Pain in left knee: Secondary | ICD-10-CM | POA: Diagnosis not present

## 2020-01-21 DIAGNOSIS — M1990 Unspecified osteoarthritis, unspecified site: Secondary | ICD-10-CM | POA: Diagnosis not present

## 2020-01-25 DIAGNOSIS — E039 Hypothyroidism, unspecified: Secondary | ICD-10-CM | POA: Diagnosis not present

## 2020-01-25 DIAGNOSIS — M1990 Unspecified osteoarthritis, unspecified site: Secondary | ICD-10-CM | POA: Diagnosis not present

## 2020-01-25 DIAGNOSIS — J4520 Mild intermittent asthma, uncomplicated: Secondary | ICD-10-CM | POA: Diagnosis not present

## 2020-01-25 DIAGNOSIS — R69 Illness, unspecified: Secondary | ICD-10-CM | POA: Diagnosis not present

## 2020-01-25 DIAGNOSIS — J40 Bronchitis, not specified as acute or chronic: Secondary | ICD-10-CM | POA: Diagnosis not present

## 2020-01-25 DIAGNOSIS — H35039 Hypertensive retinopathy, unspecified eye: Secondary | ICD-10-CM | POA: Diagnosis not present

## 2020-01-25 DIAGNOSIS — E785 Hyperlipidemia, unspecified: Secondary | ICD-10-CM | POA: Diagnosis not present

## 2020-01-25 DIAGNOSIS — I10 Essential (primary) hypertension: Secondary | ICD-10-CM | POA: Diagnosis not present

## 2020-03-26 DIAGNOSIS — M533 Sacrococcygeal disorders, not elsewhere classified: Secondary | ICD-10-CM | POA: Diagnosis not present

## 2020-03-26 DIAGNOSIS — S39012A Strain of muscle, fascia and tendon of lower back, initial encounter: Secondary | ICD-10-CM | POA: Diagnosis not present

## 2020-04-01 ENCOUNTER — Ambulatory Visit
Admission: RE | Admit: 2020-04-01 | Discharge: 2020-04-01 | Disposition: A | Discharge status: Home / Self Care | Payer: Medicare HMO | Source: Ambulatory Visit | Attending: Internal Medicine | Admitting: Internal Medicine

## 2020-04-01 ENCOUNTER — Other Ambulatory Visit: Payer: Self-pay | Admitting: Internal Medicine

## 2020-04-01 DIAGNOSIS — M533 Sacrococcygeal disorders, not elsewhere classified: Secondary | ICD-10-CM

## 2020-04-01 DIAGNOSIS — S39012A Strain of muscle, fascia and tendon of lower back, initial encounter: Secondary | ICD-10-CM | POA: Diagnosis not present

## 2020-04-01 DIAGNOSIS — S3992XA Unspecified injury of lower back, initial encounter: Secondary | ICD-10-CM | POA: Diagnosis not present

## 2020-04-09 DIAGNOSIS — M9903 Segmental and somatic dysfunction of lumbar region: Secondary | ICD-10-CM | POA: Diagnosis not present

## 2020-04-09 DIAGNOSIS — M5136 Other intervertebral disc degeneration, lumbar region: Secondary | ICD-10-CM | POA: Diagnosis not present

## 2020-04-09 DIAGNOSIS — M62830 Muscle spasm of back: Secondary | ICD-10-CM | POA: Diagnosis not present

## 2020-04-09 DIAGNOSIS — M5137 Other intervertebral disc degeneration, lumbosacral region: Secondary | ICD-10-CM | POA: Diagnosis not present

## 2020-04-09 DIAGNOSIS — M5442 Lumbago with sciatica, left side: Secondary | ICD-10-CM | POA: Diagnosis not present

## 2020-04-09 DIAGNOSIS — M9904 Segmental and somatic dysfunction of sacral region: Secondary | ICD-10-CM | POA: Diagnosis not present

## 2020-04-10 DIAGNOSIS — M9903 Segmental and somatic dysfunction of lumbar region: Secondary | ICD-10-CM | POA: Diagnosis not present

## 2020-04-10 DIAGNOSIS — M62830 Muscle spasm of back: Secondary | ICD-10-CM | POA: Diagnosis not present

## 2020-04-10 DIAGNOSIS — M5442 Lumbago with sciatica, left side: Secondary | ICD-10-CM | POA: Diagnosis not present

## 2020-04-10 DIAGNOSIS — M5137 Other intervertebral disc degeneration, lumbosacral region: Secondary | ICD-10-CM | POA: Diagnosis not present

## 2020-04-10 DIAGNOSIS — M5136 Other intervertebral disc degeneration, lumbar region: Secondary | ICD-10-CM | POA: Diagnosis not present

## 2020-04-10 DIAGNOSIS — M9904 Segmental and somatic dysfunction of sacral region: Secondary | ICD-10-CM | POA: Diagnosis not present

## 2020-04-14 DIAGNOSIS — M62830 Muscle spasm of back: Secondary | ICD-10-CM | POA: Diagnosis not present

## 2020-04-14 DIAGNOSIS — M9904 Segmental and somatic dysfunction of sacral region: Secondary | ICD-10-CM | POA: Diagnosis not present

## 2020-04-14 DIAGNOSIS — M5136 Other intervertebral disc degeneration, lumbar region: Secondary | ICD-10-CM | POA: Diagnosis not present

## 2020-04-14 DIAGNOSIS — M9903 Segmental and somatic dysfunction of lumbar region: Secondary | ICD-10-CM | POA: Diagnosis not present

## 2020-04-14 DIAGNOSIS — M5137 Other intervertebral disc degeneration, lumbosacral region: Secondary | ICD-10-CM | POA: Diagnosis not present

## 2020-04-14 DIAGNOSIS — M5442 Lumbago with sciatica, left side: Secondary | ICD-10-CM | POA: Diagnosis not present

## 2020-04-16 DIAGNOSIS — M9903 Segmental and somatic dysfunction of lumbar region: Secondary | ICD-10-CM | POA: Diagnosis not present

## 2020-04-16 DIAGNOSIS — M62830 Muscle spasm of back: Secondary | ICD-10-CM | POA: Diagnosis not present

## 2020-04-16 DIAGNOSIS — M5136 Other intervertebral disc degeneration, lumbar region: Secondary | ICD-10-CM | POA: Diagnosis not present

## 2020-04-16 DIAGNOSIS — M9904 Segmental and somatic dysfunction of sacral region: Secondary | ICD-10-CM | POA: Diagnosis not present

## 2020-04-16 DIAGNOSIS — M5137 Other intervertebral disc degeneration, lumbosacral region: Secondary | ICD-10-CM | POA: Diagnosis not present

## 2020-04-16 DIAGNOSIS — M5442 Lumbago with sciatica, left side: Secondary | ICD-10-CM | POA: Diagnosis not present

## 2020-04-18 DIAGNOSIS — M5136 Other intervertebral disc degeneration, lumbar region: Secondary | ICD-10-CM | POA: Diagnosis not present

## 2020-04-18 DIAGNOSIS — M9904 Segmental and somatic dysfunction of sacral region: Secondary | ICD-10-CM | POA: Diagnosis not present

## 2020-04-18 DIAGNOSIS — M9903 Segmental and somatic dysfunction of lumbar region: Secondary | ICD-10-CM | POA: Diagnosis not present

## 2020-04-18 DIAGNOSIS — M5442 Lumbago with sciatica, left side: Secondary | ICD-10-CM | POA: Diagnosis not present

## 2020-04-18 DIAGNOSIS — M5137 Other intervertebral disc degeneration, lumbosacral region: Secondary | ICD-10-CM | POA: Diagnosis not present

## 2020-04-18 DIAGNOSIS — M62830 Muscle spasm of back: Secondary | ICD-10-CM | POA: Diagnosis not present

## 2020-04-21 DIAGNOSIS — M62830 Muscle spasm of back: Secondary | ICD-10-CM | POA: Diagnosis not present

## 2020-04-21 DIAGNOSIS — M5442 Lumbago with sciatica, left side: Secondary | ICD-10-CM | POA: Diagnosis not present

## 2020-04-21 DIAGNOSIS — M5136 Other intervertebral disc degeneration, lumbar region: Secondary | ICD-10-CM | POA: Diagnosis not present

## 2020-04-21 DIAGNOSIS — M9904 Segmental and somatic dysfunction of sacral region: Secondary | ICD-10-CM | POA: Diagnosis not present

## 2020-04-21 DIAGNOSIS — M5137 Other intervertebral disc degeneration, lumbosacral region: Secondary | ICD-10-CM | POA: Diagnosis not present

## 2020-04-21 DIAGNOSIS — M9903 Segmental and somatic dysfunction of lumbar region: Secondary | ICD-10-CM | POA: Diagnosis not present

## 2020-04-23 DIAGNOSIS — M62830 Muscle spasm of back: Secondary | ICD-10-CM | POA: Diagnosis not present

## 2020-04-23 DIAGNOSIS — M5442 Lumbago with sciatica, left side: Secondary | ICD-10-CM | POA: Diagnosis not present

## 2020-04-23 DIAGNOSIS — M9904 Segmental and somatic dysfunction of sacral region: Secondary | ICD-10-CM | POA: Diagnosis not present

## 2020-04-23 DIAGNOSIS — M9903 Segmental and somatic dysfunction of lumbar region: Secondary | ICD-10-CM | POA: Diagnosis not present

## 2020-04-23 DIAGNOSIS — M5137 Other intervertebral disc degeneration, lumbosacral region: Secondary | ICD-10-CM | POA: Diagnosis not present

## 2020-04-23 DIAGNOSIS — M5136 Other intervertebral disc degeneration, lumbar region: Secondary | ICD-10-CM | POA: Diagnosis not present

## 2020-04-25 DIAGNOSIS — M9903 Segmental and somatic dysfunction of lumbar region: Secondary | ICD-10-CM | POA: Diagnosis not present

## 2020-04-25 DIAGNOSIS — M5137 Other intervertebral disc degeneration, lumbosacral region: Secondary | ICD-10-CM | POA: Diagnosis not present

## 2020-04-25 DIAGNOSIS — M5136 Other intervertebral disc degeneration, lumbar region: Secondary | ICD-10-CM | POA: Diagnosis not present

## 2020-04-25 DIAGNOSIS — M9904 Segmental and somatic dysfunction of sacral region: Secondary | ICD-10-CM | POA: Diagnosis not present

## 2020-04-25 DIAGNOSIS — M5442 Lumbago with sciatica, left side: Secondary | ICD-10-CM | POA: Diagnosis not present

## 2020-04-25 DIAGNOSIS — M62830 Muscle spasm of back: Secondary | ICD-10-CM | POA: Diagnosis not present

## 2020-04-28 DIAGNOSIS — M62830 Muscle spasm of back: Secondary | ICD-10-CM | POA: Diagnosis not present

## 2020-04-28 DIAGNOSIS — M5136 Other intervertebral disc degeneration, lumbar region: Secondary | ICD-10-CM | POA: Diagnosis not present

## 2020-04-28 DIAGNOSIS — M5137 Other intervertebral disc degeneration, lumbosacral region: Secondary | ICD-10-CM | POA: Diagnosis not present

## 2020-04-28 DIAGNOSIS — M9904 Segmental and somatic dysfunction of sacral region: Secondary | ICD-10-CM | POA: Diagnosis not present

## 2020-04-28 DIAGNOSIS — M9903 Segmental and somatic dysfunction of lumbar region: Secondary | ICD-10-CM | POA: Diagnosis not present

## 2020-04-28 DIAGNOSIS — M5442 Lumbago with sciatica, left side: Secondary | ICD-10-CM | POA: Diagnosis not present

## 2020-04-30 DIAGNOSIS — M5137 Other intervertebral disc degeneration, lumbosacral region: Secondary | ICD-10-CM | POA: Diagnosis not present

## 2020-04-30 DIAGNOSIS — M62830 Muscle spasm of back: Secondary | ICD-10-CM | POA: Diagnosis not present

## 2020-04-30 DIAGNOSIS — M9903 Segmental and somatic dysfunction of lumbar region: Secondary | ICD-10-CM | POA: Diagnosis not present

## 2020-04-30 DIAGNOSIS — M5136 Other intervertebral disc degeneration, lumbar region: Secondary | ICD-10-CM | POA: Diagnosis not present

## 2020-04-30 DIAGNOSIS — M9904 Segmental and somatic dysfunction of sacral region: Secondary | ICD-10-CM | POA: Diagnosis not present

## 2020-04-30 DIAGNOSIS — M5442 Lumbago with sciatica, left side: Secondary | ICD-10-CM | POA: Diagnosis not present

## 2020-05-02 DIAGNOSIS — M9903 Segmental and somatic dysfunction of lumbar region: Secondary | ICD-10-CM | POA: Diagnosis not present

## 2020-05-02 DIAGNOSIS — M5136 Other intervertebral disc degeneration, lumbar region: Secondary | ICD-10-CM | POA: Diagnosis not present

## 2020-05-02 DIAGNOSIS — M9904 Segmental and somatic dysfunction of sacral region: Secondary | ICD-10-CM | POA: Diagnosis not present

## 2020-05-02 DIAGNOSIS — M5137 Other intervertebral disc degeneration, lumbosacral region: Secondary | ICD-10-CM | POA: Diagnosis not present

## 2020-05-02 DIAGNOSIS — M62830 Muscle spasm of back: Secondary | ICD-10-CM | POA: Diagnosis not present

## 2020-05-02 DIAGNOSIS — M5442 Lumbago with sciatica, left side: Secondary | ICD-10-CM | POA: Diagnosis not present

## 2020-05-07 DIAGNOSIS — M5136 Other intervertebral disc degeneration, lumbar region: Secondary | ICD-10-CM | POA: Diagnosis not present

## 2020-05-07 DIAGNOSIS — M9904 Segmental and somatic dysfunction of sacral region: Secondary | ICD-10-CM | POA: Diagnosis not present

## 2020-05-07 DIAGNOSIS — M5442 Lumbago with sciatica, left side: Secondary | ICD-10-CM | POA: Diagnosis not present

## 2020-05-07 DIAGNOSIS — M62830 Muscle spasm of back: Secondary | ICD-10-CM | POA: Diagnosis not present

## 2020-05-07 DIAGNOSIS — M9903 Segmental and somatic dysfunction of lumbar region: Secondary | ICD-10-CM | POA: Diagnosis not present

## 2020-05-07 DIAGNOSIS — M5137 Other intervertebral disc degeneration, lumbosacral region: Secondary | ICD-10-CM | POA: Diagnosis not present

## 2020-05-09 DIAGNOSIS — M5442 Lumbago with sciatica, left side: Secondary | ICD-10-CM | POA: Diagnosis not present

## 2020-05-09 DIAGNOSIS — M5137 Other intervertebral disc degeneration, lumbosacral region: Secondary | ICD-10-CM | POA: Diagnosis not present

## 2020-05-09 DIAGNOSIS — M9904 Segmental and somatic dysfunction of sacral region: Secondary | ICD-10-CM | POA: Diagnosis not present

## 2020-05-09 DIAGNOSIS — M62830 Muscle spasm of back: Secondary | ICD-10-CM | POA: Diagnosis not present

## 2020-05-09 DIAGNOSIS — M9903 Segmental and somatic dysfunction of lumbar region: Secondary | ICD-10-CM | POA: Diagnosis not present

## 2020-05-09 DIAGNOSIS — M5136 Other intervertebral disc degeneration, lumbar region: Secondary | ICD-10-CM | POA: Diagnosis not present

## 2020-05-12 DIAGNOSIS — M5137 Other intervertebral disc degeneration, lumbosacral region: Secondary | ICD-10-CM | POA: Diagnosis not present

## 2020-05-12 DIAGNOSIS — M9903 Segmental and somatic dysfunction of lumbar region: Secondary | ICD-10-CM | POA: Diagnosis not present

## 2020-05-12 DIAGNOSIS — M62830 Muscle spasm of back: Secondary | ICD-10-CM | POA: Diagnosis not present

## 2020-05-12 DIAGNOSIS — M9904 Segmental and somatic dysfunction of sacral region: Secondary | ICD-10-CM | POA: Diagnosis not present

## 2020-05-12 DIAGNOSIS — M5136 Other intervertebral disc degeneration, lumbar region: Secondary | ICD-10-CM | POA: Diagnosis not present

## 2020-05-12 DIAGNOSIS — M5442 Lumbago with sciatica, left side: Secondary | ICD-10-CM | POA: Diagnosis not present

## 2020-05-14 DIAGNOSIS — M9903 Segmental and somatic dysfunction of lumbar region: Secondary | ICD-10-CM | POA: Diagnosis not present

## 2020-05-14 DIAGNOSIS — M62830 Muscle spasm of back: Secondary | ICD-10-CM | POA: Diagnosis not present

## 2020-05-14 DIAGNOSIS — M5136 Other intervertebral disc degeneration, lumbar region: Secondary | ICD-10-CM | POA: Diagnosis not present

## 2020-05-14 DIAGNOSIS — M9904 Segmental and somatic dysfunction of sacral region: Secondary | ICD-10-CM | POA: Diagnosis not present

## 2020-05-14 DIAGNOSIS — M5137 Other intervertebral disc degeneration, lumbosacral region: Secondary | ICD-10-CM | POA: Diagnosis not present

## 2020-05-14 DIAGNOSIS — M5442 Lumbago with sciatica, left side: Secondary | ICD-10-CM | POA: Diagnosis not present

## 2020-05-16 DIAGNOSIS — M62830 Muscle spasm of back: Secondary | ICD-10-CM | POA: Diagnosis not present

## 2020-05-16 DIAGNOSIS — M5442 Lumbago with sciatica, left side: Secondary | ICD-10-CM | POA: Diagnosis not present

## 2020-05-16 DIAGNOSIS — M5137 Other intervertebral disc degeneration, lumbosacral region: Secondary | ICD-10-CM | POA: Diagnosis not present

## 2020-05-16 DIAGNOSIS — M9904 Segmental and somatic dysfunction of sacral region: Secondary | ICD-10-CM | POA: Diagnosis not present

## 2020-05-16 DIAGNOSIS — M9903 Segmental and somatic dysfunction of lumbar region: Secondary | ICD-10-CM | POA: Diagnosis not present

## 2020-05-16 DIAGNOSIS — M5136 Other intervertebral disc degeneration, lumbar region: Secondary | ICD-10-CM | POA: Diagnosis not present

## 2020-05-22 DIAGNOSIS — M5137 Other intervertebral disc degeneration, lumbosacral region: Secondary | ICD-10-CM | POA: Diagnosis not present

## 2020-05-22 DIAGNOSIS — M9903 Segmental and somatic dysfunction of lumbar region: Secondary | ICD-10-CM | POA: Diagnosis not present

## 2020-05-22 DIAGNOSIS — M5136 Other intervertebral disc degeneration, lumbar region: Secondary | ICD-10-CM | POA: Diagnosis not present

## 2020-05-22 DIAGNOSIS — M62830 Muscle spasm of back: Secondary | ICD-10-CM | POA: Diagnosis not present

## 2020-05-22 DIAGNOSIS — M5442 Lumbago with sciatica, left side: Secondary | ICD-10-CM | POA: Diagnosis not present

## 2020-05-22 DIAGNOSIS — M9904 Segmental and somatic dysfunction of sacral region: Secondary | ICD-10-CM | POA: Diagnosis not present

## 2020-05-29 DIAGNOSIS — M9904 Segmental and somatic dysfunction of sacral region: Secondary | ICD-10-CM | POA: Diagnosis not present

## 2020-05-29 DIAGNOSIS — M5136 Other intervertebral disc degeneration, lumbar region: Secondary | ICD-10-CM | POA: Diagnosis not present

## 2020-05-29 DIAGNOSIS — M9903 Segmental and somatic dysfunction of lumbar region: Secondary | ICD-10-CM | POA: Diagnosis not present

## 2020-05-29 DIAGNOSIS — M5137 Other intervertebral disc degeneration, lumbosacral region: Secondary | ICD-10-CM | POA: Diagnosis not present

## 2020-05-29 DIAGNOSIS — M5442 Lumbago with sciatica, left side: Secondary | ICD-10-CM | POA: Diagnosis not present

## 2020-05-29 DIAGNOSIS — M62830 Muscle spasm of back: Secondary | ICD-10-CM | POA: Diagnosis not present

## 2020-06-02 DIAGNOSIS — I10 Essential (primary) hypertension: Secondary | ICD-10-CM | POA: Diagnosis not present

## 2020-06-02 DIAGNOSIS — E039 Hypothyroidism, unspecified: Secondary | ICD-10-CM | POA: Diagnosis not present

## 2020-06-02 DIAGNOSIS — M1990 Unspecified osteoarthritis, unspecified site: Secondary | ICD-10-CM | POA: Diagnosis not present

## 2020-06-02 DIAGNOSIS — E785 Hyperlipidemia, unspecified: Secondary | ICD-10-CM | POA: Diagnosis not present

## 2020-06-02 DIAGNOSIS — H35039 Hypertensive retinopathy, unspecified eye: Secondary | ICD-10-CM | POA: Diagnosis not present

## 2020-06-02 DIAGNOSIS — J4520 Mild intermittent asthma, uncomplicated: Secondary | ICD-10-CM | POA: Diagnosis not present

## 2020-06-02 DIAGNOSIS — R69 Illness, unspecified: Secondary | ICD-10-CM | POA: Diagnosis not present

## 2020-06-02 DIAGNOSIS — J40 Bronchitis, not specified as acute or chronic: Secondary | ICD-10-CM | POA: Diagnosis not present

## 2020-06-05 DIAGNOSIS — M9904 Segmental and somatic dysfunction of sacral region: Secondary | ICD-10-CM | POA: Diagnosis not present

## 2020-06-05 DIAGNOSIS — M9903 Segmental and somatic dysfunction of lumbar region: Secondary | ICD-10-CM | POA: Diagnosis not present

## 2020-06-05 DIAGNOSIS — M5136 Other intervertebral disc degeneration, lumbar region: Secondary | ICD-10-CM | POA: Diagnosis not present

## 2020-06-05 DIAGNOSIS — M5137 Other intervertebral disc degeneration, lumbosacral region: Secondary | ICD-10-CM | POA: Diagnosis not present

## 2020-06-05 DIAGNOSIS — M5442 Lumbago with sciatica, left side: Secondary | ICD-10-CM | POA: Diagnosis not present

## 2020-06-05 DIAGNOSIS — M62830 Muscle spasm of back: Secondary | ICD-10-CM | POA: Diagnosis not present

## 2020-06-10 DIAGNOSIS — M7062 Trochanteric bursitis, left hip: Secondary | ICD-10-CM | POA: Diagnosis not present

## 2020-06-10 DIAGNOSIS — J4520 Mild intermittent asthma, uncomplicated: Secondary | ICD-10-CM | POA: Diagnosis not present

## 2020-06-10 DIAGNOSIS — E039 Hypothyroidism, unspecified: Secondary | ICD-10-CM | POA: Diagnosis not present

## 2020-06-10 DIAGNOSIS — E785 Hyperlipidemia, unspecified: Secondary | ICD-10-CM | POA: Diagnosis not present

## 2020-06-10 DIAGNOSIS — Z79899 Other long term (current) drug therapy: Secondary | ICD-10-CM | POA: Diagnosis not present

## 2020-06-10 DIAGNOSIS — K219 Gastro-esophageal reflux disease without esophagitis: Secondary | ICD-10-CM | POA: Diagnosis not present

## 2020-06-10 DIAGNOSIS — H35039 Hypertensive retinopathy, unspecified eye: Secondary | ICD-10-CM | POA: Diagnosis not present

## 2020-06-10 DIAGNOSIS — M1990 Unspecified osteoarthritis, unspecified site: Secondary | ICD-10-CM | POA: Diagnosis not present

## 2020-06-10 DIAGNOSIS — I10 Essential (primary) hypertension: Secondary | ICD-10-CM | POA: Diagnosis not present

## 2020-06-10 DIAGNOSIS — R69 Illness, unspecified: Secondary | ICD-10-CM | POA: Diagnosis not present

## 2020-06-12 DIAGNOSIS — H353132 Nonexudative age-related macular degeneration, bilateral, intermediate dry stage: Secondary | ICD-10-CM | POA: Diagnosis not present

## 2020-06-12 DIAGNOSIS — H35033 Hypertensive retinopathy, bilateral: Secondary | ICD-10-CM | POA: Diagnosis not present

## 2020-06-12 DIAGNOSIS — H401122 Primary open-angle glaucoma, left eye, moderate stage: Secondary | ICD-10-CM | POA: Diagnosis not present

## 2020-06-12 DIAGNOSIS — H401111 Primary open-angle glaucoma, right eye, mild stage: Secondary | ICD-10-CM | POA: Diagnosis not present

## 2020-07-02 DIAGNOSIS — R69 Illness, unspecified: Secondary | ICD-10-CM | POA: Diagnosis not present

## 2020-07-14 DIAGNOSIS — H35039 Hypertensive retinopathy, unspecified eye: Secondary | ICD-10-CM | POA: Diagnosis not present

## 2020-07-14 DIAGNOSIS — J40 Bronchitis, not specified as acute or chronic: Secondary | ICD-10-CM | POA: Diagnosis not present

## 2020-07-14 DIAGNOSIS — R69 Illness, unspecified: Secondary | ICD-10-CM | POA: Diagnosis not present

## 2020-07-14 DIAGNOSIS — J4520 Mild intermittent asthma, uncomplicated: Secondary | ICD-10-CM | POA: Diagnosis not present

## 2020-07-14 DIAGNOSIS — I10 Essential (primary) hypertension: Secondary | ICD-10-CM | POA: Diagnosis not present

## 2020-07-14 DIAGNOSIS — E039 Hypothyroidism, unspecified: Secondary | ICD-10-CM | POA: Diagnosis not present

## 2020-07-14 DIAGNOSIS — M1990 Unspecified osteoarthritis, unspecified site: Secondary | ICD-10-CM | POA: Diagnosis not present

## 2020-07-14 DIAGNOSIS — E785 Hyperlipidemia, unspecified: Secondary | ICD-10-CM | POA: Diagnosis not present

## 2020-07-21 DIAGNOSIS — R69 Illness, unspecified: Secondary | ICD-10-CM | POA: Diagnosis not present

## 2020-08-14 DIAGNOSIS — J4520 Mild intermittent asthma, uncomplicated: Secondary | ICD-10-CM | POA: Diagnosis not present

## 2020-08-14 DIAGNOSIS — J40 Bronchitis, not specified as acute or chronic: Secondary | ICD-10-CM | POA: Diagnosis not present

## 2020-08-14 DIAGNOSIS — I10 Essential (primary) hypertension: Secondary | ICD-10-CM | POA: Diagnosis not present

## 2020-08-14 DIAGNOSIS — M1990 Unspecified osteoarthritis, unspecified site: Secondary | ICD-10-CM | POA: Diagnosis not present

## 2020-08-14 DIAGNOSIS — E785 Hyperlipidemia, unspecified: Secondary | ICD-10-CM | POA: Diagnosis not present

## 2020-08-14 DIAGNOSIS — H35039 Hypertensive retinopathy, unspecified eye: Secondary | ICD-10-CM | POA: Diagnosis not present

## 2020-08-14 DIAGNOSIS — E039 Hypothyroidism, unspecified: Secondary | ICD-10-CM | POA: Diagnosis not present

## 2020-08-14 DIAGNOSIS — R69 Illness, unspecified: Secondary | ICD-10-CM | POA: Diagnosis not present

## 2020-09-08 DIAGNOSIS — I10 Essential (primary) hypertension: Secondary | ICD-10-CM | POA: Diagnosis not present

## 2020-09-08 DIAGNOSIS — M7062 Trochanteric bursitis, left hip: Secondary | ICD-10-CM | POA: Diagnosis not present

## 2020-09-08 DIAGNOSIS — G2581 Restless legs syndrome: Secondary | ICD-10-CM | POA: Diagnosis not present

## 2020-09-08 DIAGNOSIS — R32 Unspecified urinary incontinence: Secondary | ICD-10-CM | POA: Diagnosis not present

## 2020-09-08 DIAGNOSIS — R3915 Urgency of urination: Secondary | ICD-10-CM | POA: Diagnosis not present

## 2020-10-16 ENCOUNTER — Ambulatory Visit
Admission: RE | Admit: 2020-10-16 | Discharge: 2020-10-16 | Disposition: A | Discharge status: Home / Self Care | Payer: Medicare HMO | Source: Ambulatory Visit | Attending: Internal Medicine | Admitting: Internal Medicine

## 2020-10-16 ENCOUNTER — Other Ambulatory Visit: Payer: Self-pay | Admitting: Internal Medicine

## 2020-10-16 DIAGNOSIS — M545 Low back pain, unspecified: Secondary | ICD-10-CM | POA: Diagnosis not present

## 2020-10-16 DIAGNOSIS — R3915 Urgency of urination: Secondary | ICD-10-CM | POA: Diagnosis not present

## 2020-10-16 DIAGNOSIS — M5442 Lumbago with sciatica, left side: Secondary | ICD-10-CM | POA: Diagnosis not present

## 2020-10-16 DIAGNOSIS — M5489 Other dorsalgia: Secondary | ICD-10-CM

## 2020-10-16 DIAGNOSIS — E669 Obesity, unspecified: Secondary | ICD-10-CM | POA: Diagnosis not present

## 2020-10-16 DIAGNOSIS — G2581 Restless legs syndrome: Secondary | ICD-10-CM | POA: Diagnosis not present

## 2020-10-16 DIAGNOSIS — I739 Peripheral vascular disease, unspecified: Secondary | ICD-10-CM | POA: Diagnosis not present

## 2020-10-16 DIAGNOSIS — M5432 Sciatica, left side: Secondary | ICD-10-CM | POA: Diagnosis not present

## 2020-10-16 DIAGNOSIS — R32 Unspecified urinary incontinence: Secondary | ICD-10-CM | POA: Diagnosis not present

## 2020-10-16 DIAGNOSIS — I10 Essential (primary) hypertension: Secondary | ICD-10-CM | POA: Diagnosis not present

## 2020-11-11 DIAGNOSIS — M545 Low back pain, unspecified: Secondary | ICD-10-CM | POA: Diagnosis not present

## 2020-11-11 DIAGNOSIS — M546 Pain in thoracic spine: Secondary | ICD-10-CM | POA: Diagnosis not present

## 2020-11-20 DIAGNOSIS — M546 Pain in thoracic spine: Secondary | ICD-10-CM | POA: Diagnosis not present

## 2020-11-20 DIAGNOSIS — M545 Low back pain, unspecified: Secondary | ICD-10-CM | POA: Diagnosis not present

## 2020-11-27 DIAGNOSIS — M546 Pain in thoracic spine: Secondary | ICD-10-CM | POA: Diagnosis not present

## 2020-11-27 DIAGNOSIS — M545 Low back pain, unspecified: Secondary | ICD-10-CM | POA: Diagnosis not present

## 2020-12-01 ENCOUNTER — Other Ambulatory Visit: Payer: Self-pay | Admitting: Orthopedic Surgery

## 2020-12-09 DIAGNOSIS — E039 Hypothyroidism, unspecified: Secondary | ICD-10-CM | POA: Diagnosis not present

## 2020-12-09 DIAGNOSIS — J4520 Mild intermittent asthma, uncomplicated: Secondary | ICD-10-CM | POA: Diagnosis not present

## 2020-12-09 DIAGNOSIS — I10 Essential (primary) hypertension: Secondary | ICD-10-CM | POA: Diagnosis not present

## 2020-12-09 DIAGNOSIS — H35039 Hypertensive retinopathy, unspecified eye: Secondary | ICD-10-CM | POA: Diagnosis not present

## 2020-12-09 DIAGNOSIS — K219 Gastro-esophageal reflux disease without esophagitis: Secondary | ICD-10-CM | POA: Diagnosis not present

## 2020-12-09 DIAGNOSIS — M1990 Unspecified osteoarthritis, unspecified site: Secondary | ICD-10-CM | POA: Diagnosis not present

## 2020-12-09 DIAGNOSIS — R69 Illness, unspecified: Secondary | ICD-10-CM | POA: Diagnosis not present

## 2020-12-09 DIAGNOSIS — J40 Bronchitis, not specified as acute or chronic: Secondary | ICD-10-CM | POA: Diagnosis not present

## 2020-12-09 DIAGNOSIS — E785 Hyperlipidemia, unspecified: Secondary | ICD-10-CM | POA: Diagnosis not present

## 2020-12-22 DIAGNOSIS — M5416 Radiculopathy, lumbar region: Secondary | ICD-10-CM | POA: Diagnosis not present

## 2020-12-22 NOTE — Progress Notes (Signed)
Surgical Instructions  Your procedure is scheduled on Thursday, March 10th.  Report to [facility] Main Entrance "A" at 8:00 A.M., then check in with the Admitting office.  Call this number if you have problems the morning of surgery:  [phone]   If you have any questions prior to your surgery date call [phone]: Open Monday-Friday 8am-4pm   Remember:  Do not eat after midnight the night before your surgery  You may drink clear liquids until 8:00 A.M. the morning of your surgery.   Clear liquids allowed are: Water, Non-Citrus Juices (without pulp), Carbonated Beverages, Clear Tea, Black Coffee Only, and Gatorade    Enhanced Recovery after Surgery for Orthopedics Enhanced Recovery after Surgery is a protocol used to improve the stress on your body and your recovery after surgery.  Patient Instructions  . The night before surgery:  o No food after midnight. ONLY clear liquids after midnight   . The day of surgery (if you do NOT have diabetes):  o Drink ONE (1) Pre-Surgery Clear Ensure by 8:00 A.M. the morning of surgery.This drink was given to you during your hospital  pre-op appointment visit. o Nothing else to drink after completing the  Pre-Surgery Clear Ensure.         If you have questions, please contact your surgeon's office.   Take these medicines the morning of surgery with A SIP OF WATER  amLODipine (NORVASC)  levothyroxine (SYNTHROID, LEVOTHROID)  metoprolol succinate (TOPROL-XL)  sertraline (ZOLOFT)    Follow your surgeon's instructions on when to stop Aspirin.  If no instructions were given by your surgeon then you will need to call the office to get those instructions.    As of today, STOP taking  Aleve, Naproxen, Ibuprofen, Motrin, Advil, Goody's, BC's, all herbal medications, fish oil, and all vitamins.                     Do not wear jewelry, make up, or nail polish            Do not wear lotions, powders, perfumes, or deodorant.            Do not  shave 48 hours prior to surgery.              Do not bring valuables to the hospital.            [facility] is not responsible for any belongings or valuables.  Do NOT Smoke (Tobacco/Vaping) or drink Alcohol 24 hours prior to your procedure If you use a CPAP at night, you may bring all equipment for your overnight stay.   Contacts, glasses, dentures or bridgework may not be worn into surgery, please bring cases for these belongings   For patients admitted to the hospital, discharge time will be determined by your treatment team.   Patients discharged the day of surgery will not be allowed to drive home, and someone needs to stay with them for 24 hours.  Special instructions:   [facility]- Preparing For Surgery  Before surgery, you can play an important role. Because skin is not sterile, your skin needs to be as free of germs as possible. You can reduce the number of germs on your skin by washing with CHG (chlorahexidine gluconate) Soap before surgery.  CHG is an antiseptic cleaner which kills germs and bonds with the skin to continue killing germs even after washing.    Oral Hygiene is also important to reduce your risk of infection.  Remember - BRUSH YOUR TEETH THE MORNING OF SURGERY WITH YOUR REGULAR TOOTHPASTE  Please do not use if you have an allergy to CHG or antibacterial soaps. If your skin becomes reddened/irritated stop using the CHG.  Do not shave (including legs and underarms) for at least 48 hours prior to first CHG shower. It is OK to shave your face.  Please follow these instructions carefully.   1. Shower the NIGHT BEFORE SURGERY and the MORNING OF SURGERY  2. If you chose to wash your hair, wash your hair first as usual with your normal shampoo.After you shampoo, rinse your hair and body thoroughly to remove the shampoo.  3. Wash Face and genitals (private parts) with your normal soap.   4. Use CHG Soap as you would any other liquid soap. You can apply CHG directly to  the skin and wash gently with a scrungie or a clean washcloth.   5. Apply the CHG Soap to your body ONLY FROM THE NECK DOWN.  Do not use on open wounds or open sores. Avoid contact with your eyes, ears, mouth and genitals (private parts). Wash Face and genitals (private parts)  with your normal soap.   6. Wash thoroughly, paying special attention to the area where your surgery will be performed.  7. Thoroughly rinse your body with warm water from the neck down.  8. DO NOT shower/wash with your normal soap after using and rinsing off the CHG Soap.  9. Pat yourself dry with a CLEAN TOWEL.  10. Wear CLEAN PAJAMAS to bed the night before surgery  11. Place CLEAN SHEETS on your bed the night before your surgery  12. DO NOT SLEEP WITH PETS.  Day of Surgery: Shower with CHG soap Wear Clean/Comfortable clothing the morning of surgery Do not apply any deodorants/lotions.   Remember to brush your teeth WITH YOUR REGULAR TOOTHPASTE.   Please read over the following fact sheets that you were given.

## 2020-12-23 ENCOUNTER — Other Ambulatory Visit: Payer: Self-pay

## 2020-12-23 ENCOUNTER — Encounter (HOSPITAL_COMMUNITY)
Admission: RE | Admit: 2020-12-23 | Discharge: 2020-12-23 | Disposition: A | Discharge status: Home / Self Care | Payer: Medicare HMO | Source: Ambulatory Visit | Attending: Orthopedic Surgery | Admitting: Orthopedic Surgery

## 2020-12-23 ENCOUNTER — Other Ambulatory Visit (HOSPITAL_COMMUNITY)
Admission: RE | Admit: 2020-12-23 | Discharge: 2020-12-23 | Disposition: A | Discharge status: Home / Self Care | Payer: Medicare HMO | Source: Ambulatory Visit | Attending: Orthopedic Surgery | Admitting: Orthopedic Surgery

## 2020-12-23 ENCOUNTER — Encounter (HOSPITAL_COMMUNITY): Payer: Self-pay

## 2020-12-23 DIAGNOSIS — Z20822 Contact with and (suspected) exposure to covid-19: Secondary | ICD-10-CM | POA: Insufficient documentation

## 2020-12-23 DIAGNOSIS — Z01818 Encounter for other preprocedural examination: Secondary | ICD-10-CM | POA: Insufficient documentation

## 2020-12-23 DIAGNOSIS — Z01812 Encounter for preprocedural laboratory examination: Secondary | ICD-10-CM | POA: Insufficient documentation

## 2020-12-23 HISTORY — DX: Personal history of urinary calculi: Z87.442

## 2020-12-23 HISTORY — DX: Sleep apnea, unspecified: G47.30

## 2020-12-23 HISTORY — DX: Unspecified asthma, uncomplicated: J45.909

## 2020-12-23 LAB — APTT: aPTT: 30 seconds (ref 24–36)

## 2020-12-23 LAB — SARS CORONAVIRUS 2 (TAT 6-24 HRS): SARS Coronavirus 2: NEGATIVE

## 2020-12-23 LAB — COMPREHENSIVE METABOLIC PANEL
ALT: 16 U/L (ref 0–44)
AST: 20 U/L (ref 15–41)
Albumin: 4.2 g/dL (ref 3.5–5.0)
Alkaline Phosphatase: 65 U/L (ref 38–126)
Anion gap: 9 (ref 5–15)
BUN: 24 mg/dL — ABNORMAL HIGH (ref 8–23)
CO2: 26 mmol/L (ref 22–32)
Calcium: 9.7 mg/dL (ref 8.9–10.3)
Chloride: 99 mmol/L (ref 98–111)
Creatinine, Ser: 0.98 mg/dL (ref 0.44–1.00)
GFR, Estimated: >60 mL/min (ref >60)
Glucose, Bld: 96 mg/dL (ref 70–99)
Potassium: 4.2 mmol/L (ref 3.5–5.1)
Sodium: 134 mmol/L — ABNORMAL LOW (ref 135–145)
Total Bilirubin: 0.6 mg/dL (ref 0.3–1.2)
Total Protein: 8.1 g/dL (ref 6.5–8.1)

## 2020-12-23 LAB — CBC WITH DIFFERENTIAL/PLATELET
Abs Immature Granulocytes: 0.01 10*3/uL (ref 0.00–0.07)
Basophils Absolute: 0.1 10*3/uL (ref 0.0–0.1)
Basophils Relative: 1 %
Eosinophils Absolute: 0.3 10*3/uL (ref 0.0–0.5)
Eosinophils Relative: 4 %
HCT: 40.6 % (ref 36.0–46.0)
Hemoglobin: 13.2 g/dL (ref 12.0–15.0)
Immature Granulocytes: 0 %
Lymphocytes Relative: 21 %
Lymphs Abs: 1.6 10*3/uL (ref 0.7–4.0)
MCH: 27.6 pg (ref 26.0–34.0)
MCHC: 32.5 g/dL (ref 30.0–36.0)
MCV: 84.8 fL (ref 80.0–100.0)
Monocytes Absolute: 0.5 10*3/uL (ref 0.1–1.0)
Monocytes Relative: 7 %
Neutro Abs: 5 10*3/uL (ref 1.7–7.7)
Neutrophils Relative %: 67 %
Platelets: 307 10*3/uL (ref 150–400)
RBC: 4.79 MIL/uL (ref 3.87–5.11)
RDW: 13.7 % (ref 11.5–15.5)
WBC: 7.4 10*3/uL (ref 4.0–10.5)
nRBC: 0 % (ref 0.0–0.2)

## 2020-12-23 LAB — PROTIME-INR
INR: 1 (ref 0.8–1.2)
Prothrombin Time: 12.7 seconds (ref 11.4–15.2)

## 2020-12-23 LAB — URINALYSIS, ROUTINE W REFLEX MICROSCOPIC
Bilirubin Urine: NEGATIVE
Glucose, UA: NEGATIVE mg/dL
Hgb urine dipstick: NEGATIVE
Ketones, ur: NEGATIVE mg/dL
Leukocytes,Ua: NEGATIVE
Nitrite: NEGATIVE
Protein, ur: NEGATIVE mg/dL
Specific Gravity, Urine: 1.009 (ref 1.005–1.030)
pH: 7 (ref 5.0–8.0)

## 2020-12-23 LAB — SURGICAL PCR SCREEN
MRSA, PCR: NEGATIVE
Staphylococcus aureus: NEGATIVE

## 2020-12-23 NOTE — Progress Notes (Signed)
PCP: Dr. Kevan Ny Cardiologist: Denies  EKG: Today CXR: n/a ECHO: denies Stress Test: 10-26-2007 Cardiac Cath: Reports had one a [facility] "25 years ago".  Reports "It was fine"--unable to find records in Epic  Going for Covid testing today--aware to quarentine  ERAS Ensure provided.  Patient denies shortness of breath, fever, cough, and chest pain at PAT appointment.  Patient verbalized understanding of instructions provided today at the PAT appointment.  Patient asked to review instructions at home and day of surgery.

## 2020-12-24 NOTE — Anesthesia Preprocedure Evaluation (Addendum)
Anesthesia Evaluation  Patient identified by MRN, date of birth, ID band Patient awake    Reviewed: Allergy & Precautions, NPO status , Patient's Chart, lab work & pertinent test results, reviewed documented beta blocker date and time   History of Anesthesia Complications Negative for: history of anesthetic complications  Airway Mallampati: II  TM Distance: >3 FB Neck ROM: Full    Dental  (+) Dental Advisory Given   Pulmonary asthma , sleep apnea and Continuous Positive Airway Pressure Ventilation ,  12/23/2020 SARS coronavirus NEG   breath sounds clear to auscultation       Cardiovascular hypertension, Pt. on medications and Pt. on home beta blockers  Rhythm:Regular Rate:Normal     Neuro/Psych Anxiety T12 compression fracture    GI/Hepatic Neg liver ROS, GERD  Medicated and Controlled,  Endo/Other  Hypothyroidism Morbid obesity  Renal/GU negative Renal ROS     Musculoskeletal  (+) Arthritis , Osteoarthritis,    Abdominal (+) + obese,   Peds  Hematology negative hematology ROS (+)   Anesthesia Other Findings   Reproductive/Obstetrics                            Anesthesia Physical Anesthesia Plan  ASA: III  Anesthesia Plan: General   Post-op Pain Management:    Induction: Intravenous  PONV Risk Score and Plan: 3 and Ondansetron, Dexamethasone and Treatment may vary due to age or medical condition  Airway Management Planned: Oral ETT  Additional Equipment: None  Intra-op Plan:   Post-operative Plan: Extubation in OR  Informed Consent: I have reviewed the patients History and Physical, chart, labs and discussed the procedure including the risks, benefits and alternatives for the proposed anesthesia with the patient or authorized representative who has indicated his/her understanding and acceptance.     Dental advisory given  Plan Discussed with: CRNA and Surgeon  Anesthesia  Plan Comments: (PAT note written 12/24/2020 by Shonna Chock, PA-C. )       Anesthesia Quick Evaluation

## 2020-12-24 NOTE — Progress Notes (Signed)
Anesthesia Chart Review:  Case: [id number] Date/Time: 12/25/20 1000   Procedure: THORACIC 12 KYPHOPLASTY (N/A )   Anesthesia type: General   Pre-op diagnosis: PROMINENT THORACIC 12 COMPRESSION FRACTURE   Location: [facility] ROOM 20 / [facility]   Surgeons: Estill Bamberg, MD      DISCUSSION: Patient is a 75 year old female scheduled for the above procedure.  History includes never smoker, HTN, HLD, GERD, asthma, anxiety, glaucoma, macular degeneration, OSA (CPAP), hypothyroidism.    12/23/2020 presurgical COVID-19 test negative.  Anesthesia team to evaluate on the day of surgery.   VS: BP (!) 149/72   Pulse 60   Temp 36.6 C (Oral)   Resp 17   Ht 5\' 7"  (1.702 m)   Wt 91.2 kg   SpO2 98%   BMI 31.48 kg/m   PROVIDERS: , MD is PCP   LABS: Labs reviewed: Acceptable for surgery. (all labs ordered are listed, but only abnormal results are displayed)  Labs Reviewed  COMPREHENSIVE METABOLIC PANEL - Abnormal; Notable for the following components:      Result Value   Sodium 134 (*)    BUN 24 (*)    All other components within normal limits  URINALYSIS, ROUTINE W REFLEX MICROSCOPIC - Abnormal; Notable for the following components:   Color, Urine STRAW (*)    All other components within normal limits  SURGICAL PCR SCREEN  CBC WITH DIFFERENTIAL/PLATELET  PROTIME-INR  APTT  TYPE AND SCREEN    Spirometry 11/09/16: FVC 2.96 (87%). FEV1 2.30 (89%). FEV1R 0.78 (103%). FEF25-75 2.11 (100%). Interpretation:  Normal ventilatory function.   IMAGES: Xray L-spine 10/16/20: IMPRESSION: 1. 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 2. Severe L1 compression fracture, suspicious for the symptomatic abnormality in this clinical setting. If specific therapy is desired, Lumbar MRI without contrast or Nuclear Medicine Whole-body Bone Scan would confirm candidacy for vertebroplasty. 3. Osteopenia. No other osseous abnormality identified in the lumbar spine.   EKG:  12/23/20: Sinus bradycardia at 54 bpm Minimal voltage criteria for LVH, may be normal variant ( Cornell product ) Borderline ECG   CV: Nuclear stress test 10/26/07:  Overall impression There is mild apical thinning but no sign of scar or ischemia.  EF 75%.  She thought she had a cardiac cath at [facility] ~ 25 years ago that was "fine." (At the time of her 2009 stress test she had reported a cardiac cath about 8 years prior that was "OK".) No copy of cardiac cath seen in [facility].    Past Medical History:  Diagnosis Date  . Anxiety   . Asthma   . GERD (gastroesophageal reflux disease)   . Glaucoma   . History of kidney stones   . Hyperlipidemia   . Hypertension   . Macular degeneration   . Osteopenia   . Sleep apnea    Home test via [facility], Dr. [facility] ordered, wears Cpap nightly  . Thyroid disease     Past Surgical History:  Procedure Laterality Date  . APPENDECTOMY    . CESAREAN SECTION    . FOREARM SURGERY      MEDICATIONS: . albuterol (PROAIR HFA) 108 (90 Base) MCG/ACT inhaler  . amLODipine (NORVASC) 5 MG tablet  . aspirin EC 81 MG tablet  . B Complex-C (B-COMPLEX WITH VITAMIN C) tablet  . budesonide (RHINOCORT AQUA) 32 MCG/ACT nasal spray  . calcium carbonate (OSCAL) 1500 (600 Ca) MG TABS tablet  . Cholecalciferol (VITAMIN D) 1000 UNITS capsule  . fish oil-omega-3 fatty acids 1000  MG capsule  . irbesartan-hydrochlorothiazide (AVALIDE) 150-12.5 MG tablet  . latanoprost (XALATAN) 0.005 % ophthalmic solution  . levothyroxine (SYNTHROID, LEVOTHROID) 125 MCG tablet  . losartan-hydrochlorothiazide (HYZAAR) 100-25 MG tablet  . metoprolol succinate (TOPROL-XL) 25 MG 24 hr tablet  . montelukast (SINGULAIR) 10 MG tablet  . pantoprazole (PROTONIX) 40 MG tablet  . sertraline (ZOLOFT) 100 MG tablet   No current facility-administered medications for this encounter.    Shonna Chock, PA-C Surgical Short Stay/Anesthesiology [facility] Phone [phone] [facility] Phone [phone] 12/24/2020 9:52 AM

## 2020-12-25 ENCOUNTER — Encounter (HOSPITAL_COMMUNITY)
Admission: RE | Disposition: A | Discharge status: Home / Self Care | Payer: Self-pay | Source: Home / Self Care | Attending: Orthopedic Surgery

## 2020-12-25 ENCOUNTER — Ambulatory Visit (HOSPITAL_COMMUNITY): Payer: Medicare HMO

## 2020-12-25 ENCOUNTER — Encounter (HOSPITAL_COMMUNITY): Payer: Self-pay | Admitting: Orthopedic Surgery

## 2020-12-25 ENCOUNTER — Ambulatory Visit (HOSPITAL_COMMUNITY): Payer: Medicare HMO | Admitting: Physician Assistant

## 2020-12-25 ENCOUNTER — Other Ambulatory Visit: Payer: Self-pay

## 2020-12-25 ENCOUNTER — Ambulatory Visit (HOSPITAL_COMMUNITY)
Admission: RE | Admit: 2020-12-25 | Discharge: 2020-12-25 | Disposition: A | Discharge status: Home / Self Care | Payer: Medicare HMO | Attending: Orthopedic Surgery | Admitting: Orthopedic Surgery

## 2020-12-25 ENCOUNTER — Ambulatory Visit (HOSPITAL_COMMUNITY): Payer: Medicare HMO | Admitting: Certified Registered Nurse Anesthetist

## 2020-12-25 DIAGNOSIS — Z419 Encounter for procedure for purposes other than remedying health state, unspecified: Secondary | ICD-10-CM

## 2020-12-25 DIAGNOSIS — Z882 Allergy status to sulfonamides status: Secondary | ICD-10-CM | POA: Diagnosis not present

## 2020-12-25 DIAGNOSIS — Z880 Allergy status to penicillin: Secondary | ICD-10-CM | POA: Insufficient documentation

## 2020-12-25 DIAGNOSIS — Z79890 Hormone replacement therapy (postmenopausal): Secondary | ICD-10-CM | POA: Diagnosis not present

## 2020-12-25 DIAGNOSIS — Z7982 Long term (current) use of aspirin: Secondary | ICD-10-CM | POA: Diagnosis not present

## 2020-12-25 DIAGNOSIS — M4854XA Collapsed vertebra, not elsewhere classified, thoracic region, initial encounter for fracture: Secondary | ICD-10-CM | POA: Diagnosis present

## 2020-12-25 DIAGNOSIS — S22080A Wedge compression fracture of T11-T12 vertebra, initial encounter for closed fracture: Secondary | ICD-10-CM | POA: Diagnosis not present

## 2020-12-25 DIAGNOSIS — M8088XA Other osteoporosis with current pathological fracture, vertebra(e), initial encounter for fracture: Secondary | ICD-10-CM | POA: Diagnosis not present

## 2020-12-25 DIAGNOSIS — G4730 Sleep apnea, unspecified: Secondary | ICD-10-CM | POA: Diagnosis not present

## 2020-12-25 DIAGNOSIS — Z981 Arthrodesis status: Secondary | ICD-10-CM | POA: Diagnosis not present

## 2020-12-25 DIAGNOSIS — I10 Essential (primary) hypertension: Secondary | ICD-10-CM | POA: Diagnosis not present

## 2020-12-25 DIAGNOSIS — Z79899 Other long term (current) drug therapy: Secondary | ICD-10-CM | POA: Diagnosis not present

## 2020-12-25 DIAGNOSIS — E039 Hypothyroidism, unspecified: Secondary | ICD-10-CM | POA: Diagnosis not present

## 2020-12-25 HISTORY — PX: KYPHOPLASTY: SHX5884

## 2020-12-25 LAB — TYPE AND SCREEN
ABO/RH(D): A POS
Antibody Screen: NEGATIVE

## 2020-12-25 LAB — ABO/RH: ABO/RH(D): A POS

## 2020-12-25 SURGERY — KYPHOPLASTY
Anesthesia: General | Site: Spine Thoracic

## 2020-12-25 MED ORDER — BACITRACIN ZINC 500 UNIT/GM EX OINT
TOPICAL_OINTMENT | CUTANEOUS | Status: AC
  Filled 2020-12-25: qty 28.35

## 2020-12-25 MED ORDER — METHOCARBAMOL 500 MG PO TABS: 500.0000 mg | ORAL_TABLET | Freq: Four times a day (QID) | ORAL | 0 refills | Status: DC | PRN

## 2020-12-25 MED ORDER — POVIDONE-IODINE 7.5 % EX SOLN: Freq: Once | CUTANEOUS | Status: DC

## 2020-12-25 MED ORDER — CHLORHEXIDINE GLUCONATE 0.12 % MT SOLN
15.0000 mL | Freq: Once | OROMUCOSAL | Status: AC
  Administered 2020-12-25: 15 mL via OROMUCOSAL
  Filled 2020-12-25: qty 15

## 2020-12-25 MED ORDER — ORAL CARE MOUTH RINSE: 15.0000 mL | Freq: Once | OROMUCOSAL | Status: AC

## 2020-12-25 MED ORDER — PROPOFOL 10 MG/ML IV BOLUS
INTRAVENOUS | Status: DC | PRN
  Administered 2020-12-25: 150 mg via INTRAVENOUS

## 2020-12-25 MED ORDER — ROCURONIUM BROMIDE 10 MG/ML (PF) SYRINGE
PREFILLED_SYRINGE | INTRAVENOUS | Status: DC | PRN
  Administered 2020-12-25: 60 mg via INTRAVENOUS

## 2020-12-25 MED ORDER — LIDOCAINE 2% (20 MG/ML) 5 ML SYRINGE
INTRAMUSCULAR | Status: DC | PRN
  Administered 2020-12-25: 80 mg via INTRAVENOUS

## 2020-12-25 MED ORDER — 0.9 % SODIUM CHLORIDE (POUR BTL) OPTIME
TOPICAL | Status: DC | PRN
  Administered 2020-12-25: 1000 mL

## 2020-12-25 MED ORDER — DEXAMETHASONE SODIUM PHOSPHATE 10 MG/ML IJ SOLN
INTRAMUSCULAR | Status: DC | PRN
  Administered 2020-12-25: 8 mg via INTRAVENOUS

## 2020-12-25 MED ORDER — VANCOMYCIN HCL IN DEXTROSE 1-5 GM/200ML-% IV SOLN
1000.0000 mg | INTRAVENOUS | Status: AC
  Administered 2020-12-25: 1000 mg via INTRAVENOUS
  Filled 2020-12-25: qty 200

## 2020-12-25 MED ORDER — GLYCOPYRROLATE 0.2 MG/ML IJ SOLN
INTRAMUSCULAR | Status: DC | PRN
  Administered 2020-12-25: .2 mg via INTRAVENOUS

## 2020-12-25 MED ORDER — BACITRACIN ZINC 500 UNIT/GM EX OINT
TOPICAL_OINTMENT | CUTANEOUS | Status: DC | PRN
  Administered 2020-12-25: 1 via TOPICAL

## 2020-12-25 MED ORDER — IOPAMIDOL (ISOVUE-300) INJECTION 61%
INTRAVENOUS | Status: DC | PRN
  Administered 2020-12-25: 50 mL

## 2020-12-25 MED ORDER — HYDROCODONE-ACETAMINOPHEN 5-325 MG PO TABS: 1.0000 | ORAL_TABLET | Freq: Four times a day (QID) | ORAL | 0 refills | Status: AC | PRN

## 2020-12-25 MED ORDER — BUPIVACAINE-EPINEPHRINE (PF) 0.25% -1:200000 IJ SOLN
INTRAMUSCULAR | Status: AC
  Filled 2020-12-25: qty 10

## 2020-12-25 MED ORDER — FENTANYL CITRATE (PF) 250 MCG/5ML IJ SOLN
INTRAMUSCULAR | Status: AC
  Filled 2020-12-25: qty 5

## 2020-12-25 MED ORDER — LACTATED RINGERS IV SOLN: INTRAVENOUS | Status: DC

## 2020-12-25 MED ORDER — ONDANSETRON HCL 4 MG/2ML IJ SOLN
INTRAMUSCULAR | Status: DC | PRN
  Administered 2020-12-25: 4 mg via INTRAVENOUS

## 2020-12-25 MED ORDER — SUGAMMADEX SODIUM 200 MG/2ML IV SOLN
INTRAVENOUS | Status: DC | PRN
  Administered 2020-12-25: 200 mg via INTRAVENOUS

## 2020-12-25 MED ORDER — ALBUTEROL SULFATE (2.5 MG/3ML) 0.083% IN NEBU
2.5000 mg | INHALATION_SOLUTION | Freq: Once | RESPIRATORY_TRACT | Status: AC
  Administered 2020-12-25: 2.5 mg via RESPIRATORY_TRACT

## 2020-12-25 MED ORDER — ALBUTEROL SULFATE (2.5 MG/3ML) 0.083% IN NEBU
INHALATION_SOLUTION | RESPIRATORY_TRACT | Status: AC
  Filled 2020-12-25: qty 3

## 2020-12-25 MED ORDER — EPHEDRINE SULFATE 50 MG/ML IJ SOLN
INTRAMUSCULAR | Status: DC | PRN
  Administered 2020-12-25: 2.5 mg via INTRAVENOUS
  Administered 2020-12-25 (×2): 5 mg via INTRAVENOUS

## 2020-12-25 MED ORDER — FENTANYL CITRATE (PF) 250 MCG/5ML IJ SOLN
INTRAMUSCULAR | Status: DC | PRN
  Administered 2020-12-25: 100 ug via INTRAVENOUS
  Administered 2020-12-25: 50 ug via INTRAVENOUS

## 2020-12-25 SURGICAL SUPPLY — 46 items
BLADE SURG 15 STRL LF DISP TIS (BLADE) ×1 IMPLANT
BLADE SURG 15 STRL SS (BLADE) ×2
CEMENT KYPHON C01A KIT/MIXER (Cement) ×1 IMPLANT
COVER MAYO STAND STRL (DRAPES) ×2 IMPLANT
COVER SURGICAL LIGHT HANDLE (MISCELLANEOUS) ×1 IMPLANT
COVER WAND RF STERILE (DRAPES) ×2 IMPLANT
CURETTE EXPRESS SZ2 7MM (INSTRUMENTS) IMPLANT
CURRETTE EXPRESS SZ2 7MM (INSTRUMENTS)
DRAPE C-ARM 42X72 X-RAY (DRAPES) ×2 IMPLANT
DRAPE HALF SHEET 40X57 (DRAPES) IMPLANT
DRAPE INCISE IOBAN 66X45 STRL (DRAPES) ×2 IMPLANT
DRAPE LAPAROTOMY T 102X78X121 (DRAPES) ×2 IMPLANT
DRAPE SURG 17X23 STRL (DRAPES) ×8 IMPLANT
DRAPE WARM FLUID 44X44 (DRAPES) ×2 IMPLANT
DURAPREP 26ML APPLICATOR (WOUND CARE) ×2 IMPLANT
GAUZE 4X4 16PLY RFD (DISPOSABLE) ×2 IMPLANT
GAUZE SPONGE 2X2 8PLY STRL LF (GAUZE/BANDAGES/DRESSINGS) ×1 IMPLANT
GLOVE BIO SURGEON STRL SZ7 (GLOVE) ×2 IMPLANT
GLOVE BIO SURGEON STRL SZ8 (GLOVE) ×2 IMPLANT
GLOVE SRG 8 PF TXTR STRL LF DI (GLOVE) ×1 IMPLANT
GLOVE SURG UNDER POLY LF SZ7 (GLOVE) ×3 IMPLANT
GLOVE SURG UNDER POLY LF SZ8 (GLOVE) ×2
GOWN STRL REUS W/ TWL LRG LVL3 (GOWN DISPOSABLE) ×2 IMPLANT
GOWN STRL REUS W/ TWL XL LVL3 (GOWN DISPOSABLE) ×1 IMPLANT
GOWN STRL REUS W/TWL LRG LVL3 (GOWN DISPOSABLE) ×4
GOWN STRL REUS W/TWL XL LVL3 (GOWN DISPOSABLE) ×2
KIT BASIN OR (CUSTOM PROCEDURE TRAY) ×2 IMPLANT
KIT TURNOVER KIT B (KITS) ×2 IMPLANT
NDL HYPO 25X1 1.5 SAFETY (NEEDLE) ×1 IMPLANT
NDL SPNL 18GX3.5 QUINCKE PK (NEEDLE) ×2 IMPLANT
NEEDLE 22X1 1/2 (OR ONLY) (NEEDLE) IMPLANT
NEEDLE HYPO 25X1 1.5 SAFETY (NEEDLE) ×2 IMPLANT
NEEDLE SPNL 18GX3.5 QUINCKE PK (NEEDLE) ×4 IMPLANT
NS IRRIG 1000ML POUR BTL (IV SOLUTION) ×2 IMPLANT
PACK UNIVERSAL I (CUSTOM PROCEDURE TRAY) ×2 IMPLANT
PAD ARMBOARD 7.5X6 YLW CONV (MISCELLANEOUS) ×4 IMPLANT
POSITIONER HEAD PRONE TRACH (MISCELLANEOUS) ×2 IMPLANT
SPONGE GAUZE 2X2 STER 10/PKG (GAUZE/BANDAGES/DRESSINGS) ×1
SUT MNCRL AB 4-0 PS2 18 (SUTURE) ×2 IMPLANT
SYR BULB IRRIG 60ML STRL (SYRINGE) ×2 IMPLANT
SYR CONTROL 10ML LL (SYRINGE) ×2 IMPLANT
TAPE CLOTH SURG 4X10 WHT LF (GAUZE/BANDAGES/DRESSINGS) ×1 IMPLANT
TOWEL GREEN STERILE (TOWEL DISPOSABLE) ×2 IMPLANT
TOWEL GREEN STERILE FF (TOWEL DISPOSABLE) ×2 IMPLANT
TRAY KYPHOPAK 15/3 ONESTEP 1ST (MISCELLANEOUS) ×1 IMPLANT
TRAY KYPHOPAK 20/3 ONESTEP 1ST (MISCELLANEOUS) IMPLANT

## 2020-12-25 NOTE — Anesthesia Procedure Notes (Signed)
Procedure Name: Intubation Date/Time: 12/25/2020 10:36 AM Performed by: Janace Litten, CRNA Pre-anesthesia Checklist: Patient identified, Emergency Drugs available, Suction available and Patient being monitored Patient Re-evaluated:Patient Re-evaluated prior to induction Oxygen Delivery Method: Circle System Utilized Preoxygenation: Pre-oxygenation with 100% oxygen Induction Type: IV induction Ventilation: Mask ventilation without difficulty Laryngoscope Size: Mac and 3 Grade View: Grade I Tube type: Oral Tube size: 7.0 mm Number of attempts: 1 Airway Equipment and Method: Stylet Placement Confirmation: ETT inserted through vocal cords under direct vision,  positive ETCO2 and breath sounds checked- equal and bilateral Secured at: 21 cm Tube secured with: Tape Dental Injury: Teeth and Oropharynx as per pre-operative assessment

## 2020-12-25 NOTE — Op Note (Signed)
PATIENT NAME: [name]   MEDICAL RECORD NO.:   [id number]    DATE OF BIRTH: 1945/12/04   DATE OF PROCEDURE: 12/25/2020                              OPERATIVE REPORT   PREOPERATIVE DIAGNOSIS:  T12 compression fracture.  POSTOPERATIVE DIAGNOSIS:  T12 compression fracture.  PROCEDURE:  T12 kyphoplasty.  SURGEON:  Estill Bamberg, MD.  ASSISTANTJason Coop, PA-C.  ANESTHESIA:  General endotracheal anesthesia.  COMPLICATIONS:  None.  DISPOSITION:  Stable.  ESTIMATED BLOOD LOSS:  Minimal.  INDICATIONS FOR SURGERY:  Briefly, [name] is a very pleasant 75- year-old patient, who did have an onset of pain in her back, which was rather severe.   The patient's imaging studies did clearly reveal a T12 compression fracture. We did attempt a trial of nonoperative treatment, but the patient continued to feel rather debilitated as a result of her ongoing pain.  Given her ongoing pain and dysfunction, we did discuss proceeding with the procedure noted above.  I did fully discuss the procedure with the patient, and she did wish to proceed.  OPERATIVE DETAILS:  On 12/25/2020, the patient was brought to surgery and general endotracheal anesthesia was administered.  The patient was placed prone on a well-padded flat Jackson bed with gel rolls placed under the patient's chest and hips.  Antibiotics were given.  AP and lateral fluoroscopy was brought into the field.  The T12 pedicles were marked out in the usual fashion.  After a time-out procedure was performed, I did advance Jamshidis across the T12 pedicles on the right and left sides.  I then drilled through the Jamshidis.  The bone was noted to be rather sclerotic.   I then inserted kyphoplasty balloons and I was able to minimally inflate the balloons with approximately 2cc of contrast. At this point, after the cement was mixed, a total of approximately 3cc of cement was injected, half on the right, and half on the left.   Excellent interdigitation of cement was identified.  Some cement did extend just above the fracture endplate.  This was allowed to harden, and additional cement was injected, and did interdigitate primarily at the posterior aspect of the vertebral body, and partially at the anterior aspect as well.  There is no extravasation of cement laterally or posteriorly into the spinal canal, or in the vicinity of the neurologic structures.  The cement was then allowed to harden, after which point the Jamshidis were removed.  The wound  was then irrigated and closed using 4-0 Monocryl.  Bacitracin and a sterile dressing were applied.  The patient was then awoken from general endotracheal anesthesia and transferred to recovery in stable condition.   Estill Bamberg, MD

## 2020-12-25 NOTE — Anesthesia Postprocedure Evaluation (Addendum)
Anesthesia Post Note  Patient: [name]  Procedure(s) Performed: THORACIC TWELVE KYPHOPLASTY (N/A Spine Thoracic)     Patient location during evaluation: PACU Anesthesia Type: General Level of consciousness: awake and alert, patient cooperative and oriented Pain management: pain level controlled Vital Signs Assessment: post-procedure vital signs reviewed and stable Respiratory status: spontaneous breathing, nonlabored ventilation and respiratory function stable Cardiovascular status: blood pressure returned to baseline and stable Postop Assessment: no apparent nausea or vomiting Anesthetic complications: no   No complications documented.  Last Vitals:  Vitals:   12/25/20 1156 12/25/20 1210  BP: 134/60 128/63  Pulse: 61 (!) 58  Resp: 14 15  Temp:  36.6 C  SpO2: 100% 100%    Last Pain:  Vitals:   12/25/20 1156  TempSrc:   PainSc: 0-No pain                 Peyten Punches,E. Paula Zietz

## 2020-12-25 NOTE — Transfer of Care (Signed)
Immediate Anesthesia Transfer of Care Note  Patient: [name]  Procedure(s) Performed: THORACIC TWELVE KYPHOPLASTY (N/A Spine Thoracic)  Patient Location: PACU  Anesthesia Type:General  Level of Consciousness: drowsy, patient cooperative and responds to stimulation  Airway & Oxygen Therapy: Patient Spontanous Breathing and Patient connected to nasal cannula oxygen  Post-op Assessment: Report given to RN and Post -op Vital signs reviewed and stable  Post vital signs: Reviewed and stable  Last Vitals:  Vitals Value Taken Time  BP 134/60 12/25/20 1156  Temp 36.6 C 12/25/20 1145  Pulse 62 12/25/20 1202  Resp 12 12/25/20 1202  SpO2 99 % 12/25/20 1202  Vitals shown include unvalidated device data.  Last Pain:  Vitals:   12/25/20 1145  TempSrc:   PainSc: 0-No pain         Complications: No complications documented.

## 2020-12-25 NOTE — H&P (Signed)
PREOPERATIVE H&P  Chief Complaint: back pain  HPI: [name] is a 75 y.o. female who presents with ongoing pain in the back  MRI reveals a subacute T12 compression fracture  Patient has failed multiple forms of conservative care and continues to have pain (see office notes for additional details regarding the patient's full course of treatment)  Past Medical History:  Diagnosis Date  . Anxiety   . Asthma   . GERD (gastroesophageal reflux disease)   . Glaucoma   . History of kidney stones   . Hyperlipidemia   . Hypertension   . Macular degeneration   . Osteopenia   . Sleep apnea    Home test via [facility], Dr. Earl Gala ordered, wears Cpap nightly  . Thyroid disease    Past Surgical History:  Procedure Laterality Date  . APPENDECTOMY    . CESAREAN SECTION    . FOREARM SURGERY     Social History   Socioeconomic History  . Marital status: Married    Spouse name: Not on file  . Number of children: Not on file  . Years of education: Not on file  . Highest education level: Not on file  Occupational History  . Not on file  Tobacco Use  . Smoking status: Never Smoker  . Smokeless tobacco: Never Used  Vaping Use  . Vaping Use: Never used  Substance and Sexual Activity  . Alcohol use: No  . Drug use: No  . Sexual activity: Not on file  Other Topics Concern  . Not on file  Social History Narrative  . Not on file   Social Determinants of Health   Financial Resource Strain: Not on file  Food Insecurity: Not on file  Transportation Needs: Not on file  Physical Activity: Not on file  Stress: Not on file  Social Connections: Not on file   Family History  Problem Relation Age of Onset  . Heart disease Mother   . Hypertension Mother   . Heart disease Father   . Stroke Father   . Hypertension Father    Allergies  Allergen Reactions  . Ropinirole Hydrochloride Other (See Comments)    Crazy feeling  . Sulfacetamide Sodium Other (See Comments)    Bad  reaction many years ago - doesn't remember details  . Penicillins Rash   Prior to Admission medications   Medication Sig Start Date End Date Taking? Authorizing Provider  amLODipine (NORVASC) 5 MG tablet Take 5 mg by mouth daily.   Yes [provider]  aspirin EC 81 MG tablet Take 81 mg by mouth daily.   Yes [provider]  calcium carbonate (OSCAL) 1500 (600 Ca) MG TABS tablet Take 600 mg of elemental calcium by mouth daily with breakfast.   Yes [provider]  irbesartan-hydrochlorothiazide (AVALIDE) 150-12.5 MG tablet Take 1 tablet by mouth daily. 10/16/20  Yes [provider]  latanoprost (XALATAN) 0.005 % ophthalmic solution Place 1 drop into both eyes at bedtime.   Yes [provider]  levothyroxine (SYNTHROID, LEVOTHROID) 125 MCG tablet Take 1 tablet (125 mcg total) by mouth daily. 12/21/11  Yes Baxley, Luanna Cole, MD  metoprolol succinate (TOPROL-XL) 25 MG 24 hr tablet Take 25 mg by mouth daily.   Yes [provider]  sertraline (ZOLOFT) 100 MG tablet Take 100 mg by mouth daily.   Yes [provider]  albuterol (PROAIR HFA) 108 (90 Base) MCG/ACT inhaler Inhale two puffs every four to six hours as needed  for cough or wheeze. Patient not taking: No sig reported 04/15/16   Kozlow, Alvira Philips, MD  B Complex-C (B-COMPLEX WITH VITAMIN C) tablet Take 1 tablet by mouth daily.   Patient not taking: No sig reported    [provider]  budesonide (RHINOCORT AQUA) 32 MCG/ACT nasal spray Place 1 spray into both nostrils daily. Patient not taking: No sig reported    [provider]  Cholecalciferol (VITAMIN D) 1000 UNITS capsule Take 1,000 Units by mouth daily.   Patient not taking: No sig reported    [provider]  fish oil-omega-3 fatty acids 1000 MG capsule Take 1 g by mouth daily.  Patient not taking: No sig reported    [provider]  losartan-hydrochlorothiazide (HYZAAR) 100-25 MG tablet Take 1 tablet  by mouth daily. Patient not taking: No sig reported    [provider]  montelukast (SINGULAIR) 10 MG tablet Take one tablet once daily Patient not taking: No sig reported 11/09/16   Kozlow, Alvira Philips, MD  pantoprazole (PROTONIX) 40 MG tablet Take one tablet twice daily before breakfast Patient not taking: No sig reported 11/09/16   Kozlow, Alvira Philips, MD     All other systems have been reviewed and were otherwise negative with the exception of those mentioned in the HPI and as above.  Physical Exam: There were no vitals filed for this visit.  There is no height or weight on file to calculate BMI.  General: Alert, no acute distress Cardiovascular: No pedal edema Respiratory: No cyanosis, no use of accessory musculature Skin: No lesions in the area of chief complaint Neurologic: Sensation intact distally Psychiatric: Patient is competent for consent with normal mood and affect Lymphatic: No axillary or cervical lymphadenopathy   Assessment/Plan: PROMINENT THORACIC 12 COMPRESSION FRACTURE Plan for Procedure(s): THORACIC 12 KYPHOPLASTY   Jackelyn Hoehn, MD 12/25/2020 7:13 AM

## 2020-12-26 ENCOUNTER — Encounter (HOSPITAL_COMMUNITY): Payer: Self-pay | Admitting: Orthopedic Surgery

## 2021-01-13 DIAGNOSIS — M62830 Muscle spasm of back: Secondary | ICD-10-CM | POA: Diagnosis not present

## 2021-01-13 DIAGNOSIS — M545 Low back pain, unspecified: Secondary | ICD-10-CM | POA: Diagnosis not present

## 2021-01-15 DIAGNOSIS — M545 Low back pain, unspecified: Secondary | ICD-10-CM | POA: Diagnosis not present

## 2021-01-20 DIAGNOSIS — M545 Low back pain, unspecified: Secondary | ICD-10-CM | POA: Diagnosis not present

## 2021-01-22 DIAGNOSIS — M545 Low back pain, unspecified: Secondary | ICD-10-CM | POA: Diagnosis not present

## 2021-01-28 DIAGNOSIS — I10 Essential (primary) hypertension: Secondary | ICD-10-CM | POA: Diagnosis not present

## 2021-01-28 DIAGNOSIS — E669 Obesity, unspecified: Secondary | ICD-10-CM | POA: Diagnosis not present

## 2021-01-28 DIAGNOSIS — M4854XA Collapsed vertebra, not elsewhere classified, thoracic region, initial encounter for fracture: Secondary | ICD-10-CM | POA: Diagnosis not present

## 2021-01-28 DIAGNOSIS — M5432 Sciatica, left side: Secondary | ICD-10-CM | POA: Diagnosis not present

## 2021-01-28 DIAGNOSIS — G2581 Restless legs syndrome: Secondary | ICD-10-CM | POA: Diagnosis not present

## 2021-01-29 DIAGNOSIS — M545 Low back pain, unspecified: Secondary | ICD-10-CM | POA: Diagnosis not present

## 2021-02-03 DIAGNOSIS — M545 Low back pain, unspecified: Secondary | ICD-10-CM | POA: Diagnosis not present

## 2021-02-05 DIAGNOSIS — M545 Low back pain, unspecified: Secondary | ICD-10-CM | POA: Diagnosis not present

## 2021-02-10 DIAGNOSIS — M545 Low back pain, unspecified: Secondary | ICD-10-CM | POA: Diagnosis not present

## 2021-02-11 DIAGNOSIS — H35039 Hypertensive retinopathy, unspecified eye: Secondary | ICD-10-CM | POA: Diagnosis not present

## 2021-02-11 DIAGNOSIS — J4520 Mild intermittent asthma, uncomplicated: Secondary | ICD-10-CM | POA: Diagnosis not present

## 2021-02-11 DIAGNOSIS — E785 Hyperlipidemia, unspecified: Secondary | ICD-10-CM | POA: Diagnosis not present

## 2021-02-11 DIAGNOSIS — M1990 Unspecified osteoarthritis, unspecified site: Secondary | ICD-10-CM | POA: Diagnosis not present

## 2021-02-11 DIAGNOSIS — J40 Bronchitis, not specified as acute or chronic: Secondary | ICD-10-CM | POA: Diagnosis not present

## 2021-02-11 DIAGNOSIS — K219 Gastro-esophageal reflux disease without esophagitis: Secondary | ICD-10-CM | POA: Diagnosis not present

## 2021-02-11 DIAGNOSIS — R69 Illness, unspecified: Secondary | ICD-10-CM | POA: Diagnosis not present

## 2021-02-11 DIAGNOSIS — I10 Essential (primary) hypertension: Secondary | ICD-10-CM | POA: Diagnosis not present

## 2021-02-11 DIAGNOSIS — E039 Hypothyroidism, unspecified: Secondary | ICD-10-CM | POA: Diagnosis not present

## 2021-04-01 DIAGNOSIS — J4520 Mild intermittent asthma, uncomplicated: Secondary | ICD-10-CM | POA: Diagnosis not present

## 2021-04-01 DIAGNOSIS — E039 Hypothyroidism, unspecified: Secondary | ICD-10-CM | POA: Diagnosis not present

## 2021-04-01 DIAGNOSIS — H35039 Hypertensive retinopathy, unspecified eye: Secondary | ICD-10-CM | POA: Diagnosis not present

## 2021-04-01 DIAGNOSIS — J40 Bronchitis, not specified as acute or chronic: Secondary | ICD-10-CM | POA: Diagnosis not present

## 2021-04-01 DIAGNOSIS — I10 Essential (primary) hypertension: Secondary | ICD-10-CM | POA: Diagnosis not present

## 2021-04-01 DIAGNOSIS — R69 Illness, unspecified: Secondary | ICD-10-CM | POA: Diagnosis not present

## 2021-04-01 DIAGNOSIS — E785 Hyperlipidemia, unspecified: Secondary | ICD-10-CM | POA: Diagnosis not present

## 2021-04-01 DIAGNOSIS — K219 Gastro-esophageal reflux disease without esophagitis: Secondary | ICD-10-CM | POA: Diagnosis not present

## 2021-04-03 DIAGNOSIS — M4854XA Collapsed vertebra, not elsewhere classified, thoracic region, initial encounter for fracture: Secondary | ICD-10-CM | POA: Diagnosis not present

## 2021-04-03 DIAGNOSIS — M5432 Sciatica, left side: Secondary | ICD-10-CM | POA: Diagnosis not present

## 2021-04-03 DIAGNOSIS — E669 Obesity, unspecified: Secondary | ICD-10-CM | POA: Diagnosis not present

## 2021-04-03 DIAGNOSIS — G2581 Restless legs syndrome: Secondary | ICD-10-CM | POA: Diagnosis not present

## 2021-04-03 DIAGNOSIS — I10 Essential (primary) hypertension: Secondary | ICD-10-CM | POA: Diagnosis not present

## 2021-06-15 DIAGNOSIS — J4520 Mild intermittent asthma, uncomplicated: Secondary | ICD-10-CM | POA: Diagnosis not present

## 2021-06-15 DIAGNOSIS — J309 Allergic rhinitis, unspecified: Secondary | ICD-10-CM | POA: Diagnosis not present

## 2021-06-15 DIAGNOSIS — G2581 Restless legs syndrome: Secondary | ICD-10-CM | POA: Diagnosis not present

## 2021-06-15 DIAGNOSIS — G4710 Hypersomnia, unspecified: Secondary | ICD-10-CM | POA: Diagnosis not present

## 2021-06-15 DIAGNOSIS — M549 Dorsalgia, unspecified: Secondary | ICD-10-CM | POA: Diagnosis not present

## 2021-06-15 DIAGNOSIS — Z1389 Encounter for screening for other disorder: Secondary | ICD-10-CM | POA: Diagnosis not present

## 2021-06-15 DIAGNOSIS — K219 Gastro-esophageal reflux disease without esophagitis: Secondary | ICD-10-CM | POA: Diagnosis not present

## 2021-06-15 DIAGNOSIS — Z79899 Other long term (current) drug therapy: Secondary | ICD-10-CM | POA: Diagnosis not present

## 2021-06-15 DIAGNOSIS — E785 Hyperlipidemia, unspecified: Secondary | ICD-10-CM | POA: Diagnosis not present

## 2021-06-15 DIAGNOSIS — E559 Vitamin D deficiency, unspecified: Secondary | ICD-10-CM | POA: Diagnosis not present

## 2021-06-15 DIAGNOSIS — E039 Hypothyroidism, unspecified: Secondary | ICD-10-CM | POA: Diagnosis not present

## 2021-06-15 DIAGNOSIS — G4733 Obstructive sleep apnea (adult) (pediatric): Secondary | ICD-10-CM | POA: Diagnosis not present

## 2021-06-15 DIAGNOSIS — M7061 Trochanteric bursitis, right hip: Secondary | ICD-10-CM | POA: Diagnosis not present

## 2021-06-15 DIAGNOSIS — H35039 Hypertensive retinopathy, unspecified eye: Secondary | ICD-10-CM | POA: Diagnosis not present

## 2021-06-15 DIAGNOSIS — Z0000 Encounter for general adult medical examination without abnormal findings: Secondary | ICD-10-CM | POA: Diagnosis not present

## 2021-06-15 DIAGNOSIS — R69 Illness, unspecified: Secondary | ICD-10-CM | POA: Diagnosis not present

## 2021-06-15 DIAGNOSIS — I10 Essential (primary) hypertension: Secondary | ICD-10-CM | POA: Diagnosis not present

## 2021-07-16 DIAGNOSIS — R059 Cough, unspecified: Secondary | ICD-10-CM | POA: Diagnosis not present

## 2021-07-16 DIAGNOSIS — I10 Essential (primary) hypertension: Secondary | ICD-10-CM | POA: Diagnosis not present

## 2021-07-16 DIAGNOSIS — M7061 Trochanteric bursitis, right hip: Secondary | ICD-10-CM | POA: Diagnosis not present

## 2021-07-22 DIAGNOSIS — M25551 Pain in right hip: Secondary | ICD-10-CM | POA: Diagnosis not present

## 2021-07-22 DIAGNOSIS — M545 Low back pain, unspecified: Secondary | ICD-10-CM | POA: Diagnosis not present

## 2021-08-20 DIAGNOSIS — H401111 Primary open-angle glaucoma, right eye, mild stage: Secondary | ICD-10-CM | POA: Diagnosis not present

## 2021-08-20 DIAGNOSIS — H353132 Nonexudative age-related macular degeneration, bilateral, intermediate dry stage: Secondary | ICD-10-CM | POA: Diagnosis not present

## 2021-08-20 DIAGNOSIS — H18593 Other hereditary corneal dystrophies, bilateral: Secondary | ICD-10-CM | POA: Diagnosis not present

## 2021-08-20 DIAGNOSIS — H401122 Primary open-angle glaucoma, left eye, moderate stage: Secondary | ICD-10-CM | POA: Diagnosis not present

## 2021-08-21 DIAGNOSIS — N281 Cyst of kidney, acquired: Secondary | ICD-10-CM | POA: Diagnosis not present

## 2021-08-21 DIAGNOSIS — I10 Essential (primary) hypertension: Secondary | ICD-10-CM | POA: Diagnosis not present

## 2021-09-07 DIAGNOSIS — M9904 Segmental and somatic dysfunction of sacral region: Secondary | ICD-10-CM | POA: Diagnosis not present

## 2021-09-07 DIAGNOSIS — M5388 Other specified dorsopathies, sacral and sacrococcygeal region: Secondary | ICD-10-CM | POA: Diagnosis not present

## 2021-09-07 DIAGNOSIS — M9903 Segmental and somatic dysfunction of lumbar region: Secondary | ICD-10-CM | POA: Diagnosis not present

## 2021-09-07 DIAGNOSIS — M4727 Other spondylosis with radiculopathy, lumbosacral region: Secondary | ICD-10-CM | POA: Diagnosis not present

## 2021-09-07 DIAGNOSIS — M47815 Spondylosis without myelopathy or radiculopathy, thoracolumbar region: Secondary | ICD-10-CM | POA: Diagnosis not present

## 2021-09-07 DIAGNOSIS — M9902 Segmental and somatic dysfunction of thoracic region: Secondary | ICD-10-CM | POA: Diagnosis not present

## 2021-09-09 DIAGNOSIS — I701 Atherosclerosis of renal artery: Secondary | ICD-10-CM | POA: Diagnosis not present

## 2021-09-09 DIAGNOSIS — I739 Peripheral vascular disease, unspecified: Secondary | ICD-10-CM | POA: Diagnosis not present

## 2021-09-09 DIAGNOSIS — I10 Essential (primary) hypertension: Secondary | ICD-10-CM | POA: Diagnosis not present

## 2021-09-09 DIAGNOSIS — Z23 Encounter for immunization: Secondary | ICD-10-CM | POA: Diagnosis not present

## 2021-11-11 DIAGNOSIS — N182 Chronic kidney disease, stage 2 (mild): Secondary | ICD-10-CM | POA: Diagnosis not present

## 2021-11-11 DIAGNOSIS — I701 Atherosclerosis of renal artery: Secondary | ICD-10-CM | POA: Diagnosis not present

## 2021-11-11 DIAGNOSIS — E785 Hyperlipidemia, unspecified: Secondary | ICD-10-CM | POA: Diagnosis not present

## 2021-11-11 DIAGNOSIS — I129 Hypertensive chronic kidney disease with stage 1 through stage 4 chronic kidney disease, or unspecified chronic kidney disease: Secondary | ICD-10-CM | POA: Diagnosis not present

## 2021-12-10 ENCOUNTER — Other Ambulatory Visit: Payer: Self-pay | Admitting: Nephrology

## 2021-12-10 DIAGNOSIS — I701 Atherosclerosis of renal artery: Secondary | ICD-10-CM

## 2021-12-25 ENCOUNTER — Inpatient Hospital Stay: Admission: RE | Admit: 2021-12-25 | Payer: Medicare HMO | Source: Ambulatory Visit

## 2022-01-06 DIAGNOSIS — G4733 Obstructive sleep apnea (adult) (pediatric): Secondary | ICD-10-CM | POA: Diagnosis not present

## 2022-01-26 DIAGNOSIS — G4733 Obstructive sleep apnea (adult) (pediatric): Secondary | ICD-10-CM | POA: Diagnosis not present

## 2022-02-02 DIAGNOSIS — I701 Atherosclerosis of renal artery: Secondary | ICD-10-CM | POA: Diagnosis not present

## 2022-02-02 DIAGNOSIS — R69 Illness, unspecified: Secondary | ICD-10-CM | POA: Diagnosis not present

## 2022-02-02 DIAGNOSIS — I739 Peripheral vascular disease, unspecified: Secondary | ICD-10-CM | POA: Diagnosis not present

## 2022-02-02 DIAGNOSIS — M549 Dorsalgia, unspecified: Secondary | ICD-10-CM | POA: Diagnosis not present

## 2022-02-02 DIAGNOSIS — I10 Essential (primary) hypertension: Secondary | ICD-10-CM | POA: Diagnosis not present

## 2022-02-02 DIAGNOSIS — J309 Allergic rhinitis, unspecified: Secondary | ICD-10-CM | POA: Diagnosis not present

## 2022-02-02 DIAGNOSIS — E039 Hypothyroidism, unspecified: Secondary | ICD-10-CM | POA: Diagnosis not present

## 2022-02-02 DIAGNOSIS — E559 Vitamin D deficiency, unspecified: Secondary | ICD-10-CM | POA: Diagnosis not present

## 2022-02-19 ENCOUNTER — Ambulatory Visit
Admission: RE | Admit: 2022-02-19 | Discharge: 2022-02-19 | Disposition: A | Discharge status: Home / Self Care | Payer: Medicare HMO | Source: Ambulatory Visit | Attending: Nephrology | Admitting: Nephrology

## 2022-02-19 DIAGNOSIS — I701 Atherosclerosis of renal artery: Secondary | ICD-10-CM | POA: Diagnosis not present

## 2022-02-19 DIAGNOSIS — I700 Atherosclerosis of aorta: Secondary | ICD-10-CM | POA: Diagnosis not present

## 2022-02-19 DIAGNOSIS — J9811 Atelectasis: Secondary | ICD-10-CM | POA: Diagnosis not present

## 2022-02-19 DIAGNOSIS — K5730 Diverticulosis of large intestine without perforation or abscess without bleeding: Secondary | ICD-10-CM | POA: Diagnosis not present

## 2022-02-19 MED ORDER — IOPAMIDOL (ISOVUE-370) INJECTION 76%
75.0000 mL | Freq: Once | INTRAVENOUS | Status: AC | PRN
  Administered 2022-02-19: 75 mL via INTRAVENOUS

## 2022-02-22 DIAGNOSIS — H401122 Primary open-angle glaucoma, left eye, moderate stage: Secondary | ICD-10-CM | POA: Diagnosis not present

## 2022-02-22 DIAGNOSIS — H401111 Primary open-angle glaucoma, right eye, mild stage: Secondary | ICD-10-CM | POA: Diagnosis not present

## 2022-03-10 DIAGNOSIS — N182 Chronic kidney disease, stage 2 (mild): Secondary | ICD-10-CM | POA: Diagnosis not present

## 2022-03-10 DIAGNOSIS — I129 Hypertensive chronic kidney disease with stage 1 through stage 4 chronic kidney disease, or unspecified chronic kidney disease: Secondary | ICD-10-CM | POA: Diagnosis not present

## 2022-03-10 DIAGNOSIS — E785 Hyperlipidemia, unspecified: Secondary | ICD-10-CM | POA: Diagnosis not present

## 2022-03-10 DIAGNOSIS — I701 Atherosclerosis of renal artery: Secondary | ICD-10-CM | POA: Diagnosis not present

## 2022-04-06 DIAGNOSIS — R69 Illness, unspecified: Secondary | ICD-10-CM | POA: Diagnosis not present

## 2022-04-06 DIAGNOSIS — I10 Essential (primary) hypertension: Secondary | ICD-10-CM | POA: Diagnosis not present

## 2022-04-06 DIAGNOSIS — I701 Atherosclerosis of renal artery: Secondary | ICD-10-CM | POA: Diagnosis not present

## 2022-04-06 DIAGNOSIS — R600 Localized edema: Secondary | ICD-10-CM | POA: Diagnosis not present

## 2022-04-06 DIAGNOSIS — J309 Allergic rhinitis, unspecified: Secondary | ICD-10-CM | POA: Diagnosis not present

## 2022-04-06 DIAGNOSIS — E559 Vitamin D deficiency, unspecified: Secondary | ICD-10-CM | POA: Diagnosis not present

## 2022-04-06 DIAGNOSIS — R0609 Other forms of dyspnea: Secondary | ICD-10-CM | POA: Diagnosis not present

## 2022-04-06 DIAGNOSIS — E039 Hypothyroidism, unspecified: Secondary | ICD-10-CM | POA: Diagnosis not present

## 2022-04-06 DIAGNOSIS — I739 Peripheral vascular disease, unspecified: Secondary | ICD-10-CM | POA: Diagnosis not present

## 2022-04-06 DIAGNOSIS — M549 Dorsalgia, unspecified: Secondary | ICD-10-CM | POA: Diagnosis not present

## 2022-05-03 DIAGNOSIS — G4733 Obstructive sleep apnea (adult) (pediatric): Secondary | ICD-10-CM | POA: Diagnosis not present

## 2022-06-17 ENCOUNTER — Other Ambulatory Visit: Payer: Self-pay | Admitting: Internal Medicine

## 2022-06-17 DIAGNOSIS — R600 Localized edema: Secondary | ICD-10-CM | POA: Diagnosis not present

## 2022-06-17 DIAGNOSIS — R3915 Urgency of urination: Secondary | ICD-10-CM | POA: Diagnosis not present

## 2022-06-17 DIAGNOSIS — M549 Dorsalgia, unspecified: Secondary | ICD-10-CM | POA: Diagnosis not present

## 2022-06-17 DIAGNOSIS — E039 Hypothyroidism, unspecified: Secondary | ICD-10-CM | POA: Diagnosis not present

## 2022-06-17 DIAGNOSIS — R32 Unspecified urinary incontinence: Secondary | ICD-10-CM | POA: Diagnosis not present

## 2022-06-17 DIAGNOSIS — R0609 Other forms of dyspnea: Secondary | ICD-10-CM | POA: Diagnosis not present

## 2022-06-17 DIAGNOSIS — Z1331 Encounter for screening for depression: Secondary | ICD-10-CM | POA: Diagnosis not present

## 2022-06-17 DIAGNOSIS — I10 Essential (primary) hypertension: Secondary | ICD-10-CM | POA: Diagnosis not present

## 2022-06-17 DIAGNOSIS — E559 Vitamin D deficiency, unspecified: Secondary | ICD-10-CM | POA: Diagnosis not present

## 2022-06-17 DIAGNOSIS — I701 Atherosclerosis of renal artery: Secondary | ICD-10-CM | POA: Diagnosis not present

## 2022-06-17 DIAGNOSIS — F339 Major depressive disorder, recurrent, unspecified: Secondary | ICD-10-CM | POA: Diagnosis not present

## 2022-06-17 DIAGNOSIS — I739 Peripheral vascular disease, unspecified: Secondary | ICD-10-CM | POA: Diagnosis not present

## 2022-06-17 DIAGNOSIS — Z23 Encounter for immunization: Secondary | ICD-10-CM | POA: Diagnosis not present

## 2022-06-17 DIAGNOSIS — E669 Obesity, unspecified: Secondary | ICD-10-CM | POA: Diagnosis not present

## 2022-06-17 DIAGNOSIS — R69 Illness, unspecified: Secondary | ICD-10-CM | POA: Diagnosis not present

## 2022-06-17 DIAGNOSIS — Z1231 Encounter for screening mammogram for malignant neoplasm of breast: Secondary | ICD-10-CM

## 2022-06-17 DIAGNOSIS — Z0000 Encounter for general adult medical examination without abnormal findings: Secondary | ICD-10-CM | POA: Diagnosis not present

## 2022-07-13 ENCOUNTER — Ambulatory Visit: Payer: Medicare HMO

## 2022-07-26 DIAGNOSIS — Z1331 Encounter for screening for depression: Secondary | ICD-10-CM | POA: Diagnosis not present

## 2022-07-26 DIAGNOSIS — E559 Vitamin D deficiency, unspecified: Secondary | ICD-10-CM | POA: Diagnosis not present

## 2022-07-26 DIAGNOSIS — F324 Major depressive disorder, single episode, in partial remission: Secondary | ICD-10-CM | POA: Diagnosis not present

## 2022-07-26 DIAGNOSIS — I10 Essential (primary) hypertension: Secondary | ICD-10-CM | POA: Diagnosis not present

## 2022-07-26 DIAGNOSIS — E669 Obesity, unspecified: Secondary | ICD-10-CM | POA: Diagnosis not present

## 2022-07-26 DIAGNOSIS — R69 Illness, unspecified: Secondary | ICD-10-CM | POA: Diagnosis not present

## 2022-07-26 DIAGNOSIS — Z6830 Body mass index (BMI) 30.0-30.9, adult: Secondary | ICD-10-CM | POA: Diagnosis not present

## 2022-07-26 DIAGNOSIS — R7309 Other abnormal glucose: Secondary | ICD-10-CM | POA: Diagnosis not present

## 2022-07-26 DIAGNOSIS — G4733 Obstructive sleep apnea (adult) (pediatric): Secondary | ICD-10-CM | POA: Diagnosis not present

## 2022-07-30 ENCOUNTER — Ambulatory Visit
Admission: RE | Admit: 2022-07-30 | Discharge: 2022-07-30 | Disposition: A | Discharge status: Home / Self Care | Payer: Medicare HMO | Source: Ambulatory Visit | Attending: Internal Medicine | Admitting: Internal Medicine

## 2022-07-30 DIAGNOSIS — Z1231 Encounter for screening mammogram for malignant neoplasm of breast: Secondary | ICD-10-CM

## 2022-08-12 DIAGNOSIS — Z6833 Body mass index (BMI) 33.0-33.9, adult: Secondary | ICD-10-CM | POA: Diagnosis not present

## 2022-08-12 DIAGNOSIS — E669 Obesity, unspecified: Secondary | ICD-10-CM | POA: Diagnosis not present

## 2022-08-12 DIAGNOSIS — I10 Essential (primary) hypertension: Secondary | ICD-10-CM | POA: Diagnosis not present

## 2022-08-12 DIAGNOSIS — R7303 Prediabetes: Secondary | ICD-10-CM | POA: Diagnosis not present

## 2022-08-12 DIAGNOSIS — G4733 Obstructive sleep apnea (adult) (pediatric): Secondary | ICD-10-CM | POA: Diagnosis not present

## 2022-08-19 DIAGNOSIS — H524 Presbyopia: Secondary | ICD-10-CM | POA: Diagnosis not present

## 2022-08-19 DIAGNOSIS — H401111 Primary open-angle glaucoma, right eye, mild stage: Secondary | ICD-10-CM | POA: Diagnosis not present

## 2022-08-19 DIAGNOSIS — H353132 Nonexudative age-related macular degeneration, bilateral, intermediate dry stage: Secondary | ICD-10-CM | POA: Diagnosis not present

## 2022-08-19 DIAGNOSIS — H401122 Primary open-angle glaucoma, left eye, moderate stage: Secondary | ICD-10-CM | POA: Diagnosis not present

## 2022-08-19 DIAGNOSIS — H35033 Hypertensive retinopathy, bilateral: Secondary | ICD-10-CM | POA: Diagnosis not present

## 2022-08-30 DIAGNOSIS — E669 Obesity, unspecified: Secondary | ICD-10-CM | POA: Diagnosis not present

## 2022-08-30 DIAGNOSIS — G4733 Obstructive sleep apnea (adult) (pediatric): Secondary | ICD-10-CM | POA: Diagnosis not present

## 2022-08-30 DIAGNOSIS — Z6832 Body mass index (BMI) 32.0-32.9, adult: Secondary | ICD-10-CM | POA: Diagnosis not present

## 2022-08-30 DIAGNOSIS — R7303 Prediabetes: Secondary | ICD-10-CM | POA: Diagnosis not present

## 2022-09-12 DIAGNOSIS — R319 Hematuria, unspecified: Secondary | ICD-10-CM | POA: Diagnosis not present

## 2022-09-14 DIAGNOSIS — I701 Atherosclerosis of renal artery: Secondary | ICD-10-CM | POA: Diagnosis not present

## 2022-09-14 DIAGNOSIS — I129 Hypertensive chronic kidney disease with stage 1 through stage 4 chronic kidney disease, or unspecified chronic kidney disease: Secondary | ICD-10-CM | POA: Diagnosis not present

## 2022-09-14 DIAGNOSIS — N182 Chronic kidney disease, stage 2 (mild): Secondary | ICD-10-CM | POA: Diagnosis not present

## 2022-09-23 DIAGNOSIS — R0789 Other chest pain: Secondary | ICD-10-CM | POA: Diagnosis not present

## 2022-09-23 DIAGNOSIS — J0100 Acute maxillary sinusitis, unspecified: Secondary | ICD-10-CM | POA: Diagnosis not present

## 2022-09-23 DIAGNOSIS — J209 Acute bronchitis, unspecified: Secondary | ICD-10-CM | POA: Diagnosis not present

## 2022-09-23 DIAGNOSIS — R509 Fever, unspecified: Secondary | ICD-10-CM | POA: Diagnosis not present

## 2022-10-01 DIAGNOSIS — R051 Acute cough: Secondary | ICD-10-CM | POA: Diagnosis not present

## 2022-10-01 DIAGNOSIS — S2231XA Fracture of one rib, right side, initial encounter for closed fracture: Secondary | ICD-10-CM | POA: Diagnosis not present

## 2022-10-01 DIAGNOSIS — R0782 Intercostal pain: Secondary | ICD-10-CM | POA: Diagnosis not present

## 2022-10-01 DIAGNOSIS — J019 Acute sinusitis, unspecified: Secondary | ICD-10-CM | POA: Diagnosis not present

## 2022-10-20 ENCOUNTER — Other Ambulatory Visit: Payer: Self-pay | Admitting: Internal Medicine

## 2022-10-20 ENCOUNTER — Ambulatory Visit
Admission: RE | Admit: 2022-10-20 | Discharge: 2022-10-20 | Disposition: A | Discharge status: Home / Self Care | Payer: Medicare HMO | Source: Ambulatory Visit | Attending: Internal Medicine | Admitting: Internal Medicine

## 2022-10-20 DIAGNOSIS — S2241XD Multiple fractures of ribs, right side, subsequent encounter for fracture with routine healing: Secondary | ICD-10-CM | POA: Diagnosis not present

## 2022-10-20 DIAGNOSIS — R059 Cough, unspecified: Secondary | ICD-10-CM

## 2022-10-20 DIAGNOSIS — R0781 Pleurodynia: Secondary | ICD-10-CM | POA: Diagnosis not present

## 2022-10-20 DIAGNOSIS — J4520 Mild intermittent asthma, uncomplicated: Secondary | ICD-10-CM | POA: Diagnosis not present

## 2022-10-20 DIAGNOSIS — J209 Acute bronchitis, unspecified: Secondary | ICD-10-CM | POA: Diagnosis not present

## 2022-12-03 ENCOUNTER — Other Ambulatory Visit: Payer: Self-pay | Admitting: Internal Medicine

## 2022-12-03 DIAGNOSIS — M810 Age-related osteoporosis without current pathological fracture: Secondary | ICD-10-CM

## 2022-12-17 DIAGNOSIS — R7309 Other abnormal glucose: Secondary | ICD-10-CM | POA: Diagnosis not present

## 2022-12-17 DIAGNOSIS — I701 Atherosclerosis of renal artery: Secondary | ICD-10-CM | POA: Diagnosis not present

## 2022-12-17 DIAGNOSIS — K219 Gastro-esophageal reflux disease without esophagitis: Secondary | ICD-10-CM | POA: Diagnosis not present

## 2022-12-17 DIAGNOSIS — M1990 Unspecified osteoarthritis, unspecified site: Secondary | ICD-10-CM | POA: Diagnosis not present

## 2022-12-17 DIAGNOSIS — R7303 Prediabetes: Secondary | ICD-10-CM | POA: Diagnosis not present

## 2022-12-17 DIAGNOSIS — H35039 Hypertensive retinopathy, unspecified eye: Secondary | ICD-10-CM | POA: Diagnosis not present

## 2022-12-17 DIAGNOSIS — F339 Major depressive disorder, recurrent, unspecified: Secondary | ICD-10-CM | POA: Diagnosis not present

## 2022-12-17 DIAGNOSIS — G2581 Restless legs syndrome: Secondary | ICD-10-CM | POA: Diagnosis not present

## 2022-12-17 DIAGNOSIS — N1831 Chronic kidney disease, stage 3a: Secondary | ICD-10-CM | POA: Diagnosis not present

## 2022-12-17 DIAGNOSIS — E039 Hypothyroidism, unspecified: Secondary | ICD-10-CM | POA: Diagnosis not present

## 2022-12-17 DIAGNOSIS — I739 Peripheral vascular disease, unspecified: Secondary | ICD-10-CM | POA: Diagnosis not present

## 2022-12-17 DIAGNOSIS — I10 Essential (primary) hypertension: Secondary | ICD-10-CM | POA: Diagnosis not present

## 2022-12-17 DIAGNOSIS — Z9989 Dependence on other enabling machines and devices: Secondary | ICD-10-CM | POA: Diagnosis not present

## 2022-12-17 DIAGNOSIS — E559 Vitamin D deficiency, unspecified: Secondary | ICD-10-CM | POA: Diagnosis not present

## 2022-12-17 DIAGNOSIS — R69 Illness, unspecified: Secondary | ICD-10-CM | POA: Diagnosis not present

## 2022-12-22 DIAGNOSIS — G4733 Obstructive sleep apnea (adult) (pediatric): Secondary | ICD-10-CM | POA: Diagnosis not present

## 2022-12-22 DIAGNOSIS — R7303 Prediabetes: Secondary | ICD-10-CM | POA: Diagnosis not present

## 2022-12-22 DIAGNOSIS — E039 Hypothyroidism, unspecified: Secondary | ICD-10-CM | POA: Diagnosis not present

## 2022-12-22 DIAGNOSIS — Z6832 Body mass index (BMI) 32.0-32.9, adult: Secondary | ICD-10-CM | POA: Diagnosis not present

## 2022-12-22 DIAGNOSIS — E785 Hyperlipidemia, unspecified: Secondary | ICD-10-CM | POA: Diagnosis not present

## 2022-12-22 DIAGNOSIS — E6609 Other obesity due to excess calories: Secondary | ICD-10-CM | POA: Diagnosis not present

## 2022-12-22 DIAGNOSIS — I10 Essential (primary) hypertension: Secondary | ICD-10-CM | POA: Diagnosis not present

## 2022-12-23 ENCOUNTER — Other Ambulatory Visit (HOSPITAL_COMMUNITY): Payer: Self-pay

## 2022-12-23 MED ORDER — WEGOVY 0.25 MG/0.5ML ~~LOC~~ SOAJ
SUBCUTANEOUS | 0 refills | Status: DC
  Filled 2022-12-23: qty 2, 28d supply, fill #0

## 2023-01-05 ENCOUNTER — Other Ambulatory Visit (HOSPITAL_COMMUNITY): Payer: Self-pay

## 2023-01-05 DIAGNOSIS — R7303 Prediabetes: Secondary | ICD-10-CM | POA: Diagnosis not present

## 2023-01-05 DIAGNOSIS — I10 Essential (primary) hypertension: Secondary | ICD-10-CM | POA: Diagnosis not present

## 2023-01-05 DIAGNOSIS — E785 Hyperlipidemia, unspecified: Secondary | ICD-10-CM | POA: Diagnosis not present

## 2023-01-05 DIAGNOSIS — E6609 Other obesity due to excess calories: Secondary | ICD-10-CM | POA: Diagnosis not present

## 2023-01-05 DIAGNOSIS — G4733 Obstructive sleep apnea (adult) (pediatric): Secondary | ICD-10-CM | POA: Diagnosis not present

## 2023-01-05 DIAGNOSIS — E039 Hypothyroidism, unspecified: Secondary | ICD-10-CM | POA: Diagnosis not present

## 2023-01-05 DIAGNOSIS — Z6832 Body mass index (BMI) 32.0-32.9, adult: Secondary | ICD-10-CM | POA: Diagnosis not present

## 2023-01-05 MED ORDER — OZEMPIC (0.25 OR 0.5 MG/DOSE) 2 MG/3ML ~~LOC~~ SOPN
0.2500 mg | PEN_INJECTOR | SUBCUTANEOUS | 0 refills | Status: DC
  Filled 2023-01-05: qty 3, 56d supply, fill #0
  Filled 2023-01-21: qty 3, 28d supply, fill #0

## 2023-01-12 DIAGNOSIS — G4733 Obstructive sleep apnea (adult) (pediatric): Secondary | ICD-10-CM | POA: Diagnosis not present

## 2023-01-14 ENCOUNTER — Other Ambulatory Visit (HOSPITAL_COMMUNITY): Payer: Self-pay

## 2023-01-20 DIAGNOSIS — I10 Essential (primary) hypertension: Secondary | ICD-10-CM | POA: Diagnosis not present

## 2023-01-20 DIAGNOSIS — Z6832 Body mass index (BMI) 32.0-32.9, adult: Secondary | ICD-10-CM | POA: Diagnosis not present

## 2023-01-20 DIAGNOSIS — G4733 Obstructive sleep apnea (adult) (pediatric): Secondary | ICD-10-CM | POA: Diagnosis not present

## 2023-01-20 DIAGNOSIS — R7303 Prediabetes: Secondary | ICD-10-CM | POA: Diagnosis not present

## 2023-01-20 DIAGNOSIS — E669 Obesity, unspecified: Secondary | ICD-10-CM | POA: Diagnosis not present

## 2023-01-21 ENCOUNTER — Other Ambulatory Visit (HOSPITAL_COMMUNITY): Payer: Self-pay

## 2023-01-22 ENCOUNTER — Other Ambulatory Visit (HOSPITAL_COMMUNITY): Payer: Self-pay

## 2023-02-09 DIAGNOSIS — I10 Essential (primary) hypertension: Secondary | ICD-10-CM | POA: Diagnosis not present

## 2023-02-09 DIAGNOSIS — E785 Hyperlipidemia, unspecified: Secondary | ICD-10-CM | POA: Diagnosis not present

## 2023-02-09 DIAGNOSIS — Z6832 Body mass index (BMI) 32.0-32.9, adult: Secondary | ICD-10-CM | POA: Diagnosis not present

## 2023-02-09 DIAGNOSIS — E6609 Other obesity due to excess calories: Secondary | ICD-10-CM | POA: Diagnosis not present

## 2023-02-09 DIAGNOSIS — E039 Hypothyroidism, unspecified: Secondary | ICD-10-CM | POA: Diagnosis not present

## 2023-02-09 DIAGNOSIS — G4733 Obstructive sleep apnea (adult) (pediatric): Secondary | ICD-10-CM | POA: Diagnosis not present

## 2023-02-09 DIAGNOSIS — R7303 Prediabetes: Secondary | ICD-10-CM | POA: Diagnosis not present

## 2023-02-14 DIAGNOSIS — Z8781 Personal history of (healed) traumatic fracture: Secondary | ICD-10-CM | POA: Diagnosis not present

## 2023-02-14 DIAGNOSIS — M9902 Segmental and somatic dysfunction of thoracic region: Secondary | ICD-10-CM | POA: Diagnosis not present

## 2023-02-14 DIAGNOSIS — M5385 Other specified dorsopathies, thoracolumbar region: Secondary | ICD-10-CM | POA: Diagnosis not present

## 2023-02-14 DIAGNOSIS — M9903 Segmental and somatic dysfunction of lumbar region: Secondary | ICD-10-CM | POA: Diagnosis not present

## 2023-02-14 DIAGNOSIS — M461 Sacroiliitis, not elsewhere classified: Secondary | ICD-10-CM | POA: Diagnosis not present

## 2023-02-14 DIAGNOSIS — M7918 Myalgia, other site: Secondary | ICD-10-CM | POA: Diagnosis not present

## 2023-02-14 DIAGNOSIS — M9905 Segmental and somatic dysfunction of pelvic region: Secondary | ICD-10-CM | POA: Diagnosis not present

## 2023-02-21 DIAGNOSIS — M9902 Segmental and somatic dysfunction of thoracic region: Secondary | ICD-10-CM | POA: Diagnosis not present

## 2023-02-21 DIAGNOSIS — Z8781 Personal history of (healed) traumatic fracture: Secondary | ICD-10-CM | POA: Diagnosis not present

## 2023-02-21 DIAGNOSIS — M7918 Myalgia, other site: Secondary | ICD-10-CM | POA: Diagnosis not present

## 2023-02-21 DIAGNOSIS — M9903 Segmental and somatic dysfunction of lumbar region: Secondary | ICD-10-CM | POA: Diagnosis not present

## 2023-02-21 DIAGNOSIS — M461 Sacroiliitis, not elsewhere classified: Secondary | ICD-10-CM | POA: Diagnosis not present

## 2023-02-21 DIAGNOSIS — M9905 Segmental and somatic dysfunction of pelvic region: Secondary | ICD-10-CM | POA: Diagnosis not present

## 2023-02-21 DIAGNOSIS — M5385 Other specified dorsopathies, thoracolumbar region: Secondary | ICD-10-CM | POA: Diagnosis not present

## 2023-02-21 DIAGNOSIS — G4733 Obstructive sleep apnea (adult) (pediatric): Secondary | ICD-10-CM | POA: Diagnosis not present

## 2023-02-24 DIAGNOSIS — M461 Sacroiliitis, not elsewhere classified: Secondary | ICD-10-CM | POA: Diagnosis not present

## 2023-02-24 DIAGNOSIS — M9902 Segmental and somatic dysfunction of thoracic region: Secondary | ICD-10-CM | POA: Diagnosis not present

## 2023-02-24 DIAGNOSIS — M7918 Myalgia, other site: Secondary | ICD-10-CM | POA: Diagnosis not present

## 2023-02-24 DIAGNOSIS — M5385 Other specified dorsopathies, thoracolumbar region: Secondary | ICD-10-CM | POA: Diagnosis not present

## 2023-02-24 DIAGNOSIS — Z8781 Personal history of (healed) traumatic fracture: Secondary | ICD-10-CM | POA: Diagnosis not present

## 2023-02-24 DIAGNOSIS — M9903 Segmental and somatic dysfunction of lumbar region: Secondary | ICD-10-CM | POA: Diagnosis not present

## 2023-02-24 DIAGNOSIS — M9905 Segmental and somatic dysfunction of pelvic region: Secondary | ICD-10-CM | POA: Diagnosis not present

## 2023-03-09 DIAGNOSIS — G4733 Obstructive sleep apnea (adult) (pediatric): Secondary | ICD-10-CM | POA: Diagnosis not present

## 2023-03-09 DIAGNOSIS — Z6832 Body mass index (BMI) 32.0-32.9, adult: Secondary | ICD-10-CM | POA: Diagnosis not present

## 2023-03-09 DIAGNOSIS — E6609 Other obesity due to excess calories: Secondary | ICD-10-CM | POA: Diagnosis not present

## 2023-03-09 DIAGNOSIS — E039 Hypothyroidism, unspecified: Secondary | ICD-10-CM | POA: Diagnosis not present

## 2023-03-09 DIAGNOSIS — E785 Hyperlipidemia, unspecified: Secondary | ICD-10-CM | POA: Diagnosis not present

## 2023-03-09 DIAGNOSIS — I10 Essential (primary) hypertension: Secondary | ICD-10-CM | POA: Diagnosis not present

## 2023-03-09 DIAGNOSIS — R7303 Prediabetes: Secondary | ICD-10-CM | POA: Diagnosis not present

## 2023-03-10 ENCOUNTER — Other Ambulatory Visit (HOSPITAL_COMMUNITY): Payer: Self-pay

## 2023-03-10 MED ORDER — OZEMPIC (0.25 OR 0.5 MG/DOSE) 2 MG/3ML ~~LOC~~ SOPN
PEN_INJECTOR | SUBCUTANEOUS | 0 refills | Status: DC
  Filled 2023-03-10: qty 3, 56d supply, fill #0

## 2023-03-11 ENCOUNTER — Other Ambulatory Visit (HOSPITAL_COMMUNITY): Payer: Self-pay

## 2023-03-12 ENCOUNTER — Other Ambulatory Visit (HOSPITAL_COMMUNITY): Payer: Self-pay

## 2023-03-17 DIAGNOSIS — R350 Frequency of micturition: Secondary | ICD-10-CM | POA: Diagnosis not present

## 2023-03-17 DIAGNOSIS — R351 Nocturia: Secondary | ICD-10-CM | POA: Diagnosis not present

## 2023-03-17 DIAGNOSIS — N3941 Urge incontinence: Secondary | ICD-10-CM | POA: Diagnosis not present

## 2023-03-18 DIAGNOSIS — I10 Essential (primary) hypertension: Secondary | ICD-10-CM | POA: Diagnosis not present

## 2023-03-18 DIAGNOSIS — Z0000 Encounter for general adult medical examination without abnormal findings: Secondary | ICD-10-CM | POA: Diagnosis not present

## 2023-03-18 DIAGNOSIS — R195 Other fecal abnormalities: Secondary | ICD-10-CM | POA: Diagnosis not present

## 2023-03-21 DIAGNOSIS — M7918 Myalgia, other site: Secondary | ICD-10-CM | POA: Diagnosis not present

## 2023-03-21 DIAGNOSIS — M9905 Segmental and somatic dysfunction of pelvic region: Secondary | ICD-10-CM | POA: Diagnosis not present

## 2023-03-21 DIAGNOSIS — M5385 Other specified dorsopathies, thoracolumbar region: Secondary | ICD-10-CM | POA: Diagnosis not present

## 2023-03-21 DIAGNOSIS — M461 Sacroiliitis, not elsewhere classified: Secondary | ICD-10-CM | POA: Diagnosis not present

## 2023-03-21 DIAGNOSIS — Z8781 Personal history of (healed) traumatic fracture: Secondary | ICD-10-CM | POA: Diagnosis not present

## 2023-03-21 DIAGNOSIS — M9902 Segmental and somatic dysfunction of thoracic region: Secondary | ICD-10-CM | POA: Diagnosis not present

## 2023-03-21 DIAGNOSIS — M9903 Segmental and somatic dysfunction of lumbar region: Secondary | ICD-10-CM | POA: Diagnosis not present

## 2023-03-23 DIAGNOSIS — M461 Sacroiliitis, not elsewhere classified: Secondary | ICD-10-CM | POA: Diagnosis not present

## 2023-03-23 DIAGNOSIS — M9903 Segmental and somatic dysfunction of lumbar region: Secondary | ICD-10-CM | POA: Diagnosis not present

## 2023-03-23 DIAGNOSIS — M7918 Myalgia, other site: Secondary | ICD-10-CM | POA: Diagnosis not present

## 2023-03-23 DIAGNOSIS — M9905 Segmental and somatic dysfunction of pelvic region: Secondary | ICD-10-CM | POA: Diagnosis not present

## 2023-03-23 DIAGNOSIS — Z8781 Personal history of (healed) traumatic fracture: Secondary | ICD-10-CM | POA: Diagnosis not present

## 2023-03-23 DIAGNOSIS — M5385 Other specified dorsopathies, thoracolumbar region: Secondary | ICD-10-CM | POA: Diagnosis not present

## 2023-03-23 DIAGNOSIS — M9902 Segmental and somatic dysfunction of thoracic region: Secondary | ICD-10-CM | POA: Diagnosis not present

## 2023-03-30 DIAGNOSIS — M9903 Segmental and somatic dysfunction of lumbar region: Secondary | ICD-10-CM | POA: Diagnosis not present

## 2023-03-30 DIAGNOSIS — M461 Sacroiliitis, not elsewhere classified: Secondary | ICD-10-CM | POA: Diagnosis not present

## 2023-03-30 DIAGNOSIS — M5385 Other specified dorsopathies, thoracolumbar region: Secondary | ICD-10-CM | POA: Diagnosis not present

## 2023-03-30 DIAGNOSIS — M9905 Segmental and somatic dysfunction of pelvic region: Secondary | ICD-10-CM | POA: Diagnosis not present

## 2023-03-30 DIAGNOSIS — Z8781 Personal history of (healed) traumatic fracture: Secondary | ICD-10-CM | POA: Diagnosis not present

## 2023-03-30 DIAGNOSIS — M7918 Myalgia, other site: Secondary | ICD-10-CM | POA: Diagnosis not present

## 2023-03-30 DIAGNOSIS — M9902 Segmental and somatic dysfunction of thoracic region: Secondary | ICD-10-CM | POA: Diagnosis not present

## 2023-04-04 DIAGNOSIS — E6609 Other obesity due to excess calories: Secondary | ICD-10-CM | POA: Diagnosis not present

## 2023-04-04 DIAGNOSIS — Z6832 Body mass index (BMI) 32.0-32.9, adult: Secondary | ICD-10-CM | POA: Diagnosis not present

## 2023-04-04 DIAGNOSIS — R110 Nausea: Secondary | ICD-10-CM | POA: Diagnosis not present

## 2023-04-04 DIAGNOSIS — E039 Hypothyroidism, unspecified: Secondary | ICD-10-CM | POA: Diagnosis not present

## 2023-04-04 DIAGNOSIS — I10 Essential (primary) hypertension: Secondary | ICD-10-CM | POA: Diagnosis not present

## 2023-04-04 DIAGNOSIS — E785 Hyperlipidemia, unspecified: Secondary | ICD-10-CM | POA: Diagnosis not present

## 2023-04-04 DIAGNOSIS — G4733 Obstructive sleep apnea (adult) (pediatric): Secondary | ICD-10-CM | POA: Diagnosis not present

## 2023-04-04 DIAGNOSIS — R7303 Prediabetes: Secondary | ICD-10-CM | POA: Diagnosis not present

## 2023-04-17 DIAGNOSIS — N3090 Cystitis, unspecified without hematuria: Secondary | ICD-10-CM | POA: Diagnosis not present

## 2023-04-28 DIAGNOSIS — N3941 Urge incontinence: Secondary | ICD-10-CM | POA: Diagnosis not present

## 2023-04-28 DIAGNOSIS — R350 Frequency of micturition: Secondary | ICD-10-CM | POA: Diagnosis not present

## 2023-05-02 DIAGNOSIS — E039 Hypothyroidism, unspecified: Secondary | ICD-10-CM | POA: Diagnosis not present

## 2023-05-02 DIAGNOSIS — R110 Nausea: Secondary | ICD-10-CM | POA: Diagnosis not present

## 2023-05-02 DIAGNOSIS — E785 Hyperlipidemia, unspecified: Secondary | ICD-10-CM | POA: Diagnosis not present

## 2023-05-02 DIAGNOSIS — Z6832 Body mass index (BMI) 32.0-32.9, adult: Secondary | ICD-10-CM | POA: Diagnosis not present

## 2023-05-02 DIAGNOSIS — R7303 Prediabetes: Secondary | ICD-10-CM | POA: Diagnosis not present

## 2023-05-02 DIAGNOSIS — G4733 Obstructive sleep apnea (adult) (pediatric): Secondary | ICD-10-CM | POA: Diagnosis not present

## 2023-05-02 DIAGNOSIS — E6609 Other obesity due to excess calories: Secondary | ICD-10-CM | POA: Diagnosis not present

## 2023-05-02 DIAGNOSIS — I10 Essential (primary) hypertension: Secondary | ICD-10-CM | POA: Diagnosis not present

## 2023-05-03 ENCOUNTER — Other Ambulatory Visit (HOSPITAL_COMMUNITY): Payer: Self-pay

## 2023-05-03 MED ORDER — OZEMPIC (0.25 OR 0.5 MG/DOSE) 2 MG/3ML ~~LOC~~ SOPN
PEN_INJECTOR | SUBCUTANEOUS | 0 refills | Status: DC
  Filled 2023-05-03: qty 3, 30d supply, fill #0

## 2023-05-06 ENCOUNTER — Other Ambulatory Visit (HOSPITAL_COMMUNITY): Payer: Self-pay

## 2023-05-13 ENCOUNTER — Encounter: Payer: Self-pay | Admitting: Internal Medicine

## 2023-05-13 ENCOUNTER — Ambulatory Visit (INDEPENDENT_AMBULATORY_CARE_PROVIDER_SITE_OTHER): Payer: Medicare HMO | Admitting: Internal Medicine

## 2023-05-13 VITALS — BP 144/78 | HR 80 | Ht 67.0 in | Wt 200.0 lb

## 2023-05-13 DIAGNOSIS — R195 Other fecal abnormalities: Secondary | ICD-10-CM | POA: Diagnosis not present

## 2023-05-13 DIAGNOSIS — Z1211 Encounter for screening for malignant neoplasm of colon: Secondary | ICD-10-CM | POA: Diagnosis not present

## 2023-05-13 MED ORDER — PLENVU 140 G PO SOLR: 1.0000 | Freq: Once | ORAL | 0 refills | Status: AC

## 2023-05-13 NOTE — Patient Instructions (Signed)
You have been scheduled for a colonoscopy. Please follow written instructions given to you at your visit today.   Please pick up your prep supplies at the pharmacy within the next 1-3 days.  If you use inhalers (even only as needed), please bring them with you on the day of your procedure.  DO NOT TAKE 7 DAYS PRIOR TO TEST- Trulicity (dulaglutide) Ozempic, Wegovy (semaglutide) Mounjaro (tirzepatide) Bydureon Bcise (exanatide extended release)  DO NOT TAKE 1 DAY PRIOR TO YOUR TEST Rybelsus (semaglutide) Adlyxin (lixisenatide) Victoza (liraglutide) Byetta (exanatide) ___________________________________________________________________________  _______________________________________________________  If your blood pressure at your visit was 140/90 or greater, please contact your primary care physician to follow up on this.  _______________________________________________________  If you are age 63 or older, your body mass index should be between 23-30. Your Body mass index is 31.32 kg/m. If this is out of the aforementioned range listed, please consider follow up with your Primary Care Provider.  If you are age 93 or younger, your body mass index should be between 19-25. Your Body mass index is 31.32 kg/m. If this is out of the aformentioned range listed, please consider follow up with your Primary Care Provider.   ________________________________________________________  The [facility] providers would like to encourage you to use [facility] to communicate with providers for non-urgent requests or questions.  Due to long hold times on the telephone, sending your provider a message by [facility] may be a faster and more efficient way to get a response.  Please allow 48 business hours for a response.  Please remember that this is for non-urgent requests.  _______________________________________________________

## 2023-05-13 NOTE — Progress Notes (Signed)
HISTORY OF PRESENT ILLNESS:  [name] is a 77 y.o. female with past medical history as listed below who is sent today by her primary care provider, Kimberly Fall, PA-C, regarding positive Cologuard test.  Patient has been seen previously by myself in 2004 and again in 2011 for colonoscopy.  Colonoscopy in 2004 was normal.  Colonoscopy 2011 revealed diminutive hyperplastic polyp mild sigmoid diverticulosis.  Follow-up in 10 years recommended.  She has not been seen since.  About 3 to 4 months ago she reports having had Cologuard testing which returned positive.  She did see her PCP and follow-up Mar 18, 2023.  I have reviewed that report which indicates positive Cologuard testing for which she is now referred to GI.  Patient's GI review of systems is entirely negative.  No change in bowel habits, bleeding, or unexplained weight loss.  No family history of colon cancer.  REVIEW OF SYSTEMS:  All non-GI ROS negative except for urinary leakage  Past Medical History:  Diagnosis Date   Anxiety    Asthma    GERD (gastroesophageal reflux disease)    Glaucoma    History of kidney stones    Hyperlipidemia    Hypertension    Macular degeneration    Osteopenia    Sleep apnea    Home test via [facility], Dr. Earl Robles ordered, wears Cpap nightly   Thyroid disease     Past Surgical History:  Procedure Laterality Date   APPENDECTOMY     CESAREAN SECTION     FOREARM SURGERY     KYPHOPLASTY N/A 12/25/2020   Procedure: THORACIC TWELVE KYPHOPLASTY;  Surgeon: Estill Bamberg, MD;  Location: [facility] OR;  Service: Orthopedics;  Laterality: N/A;    Social History [name]  reports that she has never smoked. She has never used smokeless tobacco. She reports that she does not drink alcohol and does not use drugs.  family history includes Heart disease in her father and mother; Hypertension in her father and mother; Stroke in her father.  Allergies  Allergen Reactions   Ropinirole Hydrochloride  Other (See Comments)    Crazy feeling   Sulfacetamide Sodium Other (See Comments)    Bad reaction many years ago - doesn't remember details   Penicillins Rash       PHYSICAL EXAMINATION: Vital signs: BP (!) 144/78   Pulse 80   Ht 5\' 7"  (1.702 m)   Wt 200 lb (90.7 kg)   BMI 31.32 kg/m   Constitutional: generally well-appearing, no acute distress Psychiatric: alert and oriented x3, cooperative Eyes: extraocular movements intact, anicteric, conjunctiva pink Mouth: oral pharynx moist, no lesions Neck: supple no lymphadenopathy Cardiovascular: heart regular rate and rhythm, no murmur Lungs: clear to auscultation bilaterally Abdomen: soft, nontender, nondistended, no obvious ascites, no peritoneal signs, normal bowel sounds, no organomegaly Rectal: Deferred until colonoscopy Extremities: no clubbing, cyanosis, or lower extremity edema bilaterally Skin: no lesions on visible extremities Neuro: No focal deficits.  Cranial nerves intact  ASSESSMENT:  1.  Positive Cologuard testing 2.  Previous colonoscopy 2004 2011 were negative for neoplasia 3.  General medical problems.  Stable 4.  On Ozempic for weight reduction   PLAN:  1.  Discussion on Cologuard testing 2.  Colonoscopy.The nature of the procedure, as well as the risks, benefits, and alternatives were carefully and thoroughly reviewed with the patient. Ample time for discussion and questions allowed. The patient understood, was satisfied, and agreed to proceed. 3.  Hold Ozempic 1 to 2 weeks prior  to the procedure.  She understands and agrees A total time of 45 minutes was spent preparing to see the patient, reviewing outside records, obtaining comprehensive history, performing medically appropriate physical exam, counseling and educating the patient regarding the above listed issues, answering multiple questions regarding Cologuard testing, ordering colonoscopy, adjusting medications preprocedure, and documenting clinical  information in the health record

## 2023-05-23 ENCOUNTER — Encounter: Payer: Medicare HMO | Admitting: Internal Medicine

## 2023-05-23 ENCOUNTER — Ambulatory Visit: Admission: RE | Admit: 2023-05-23 | Payer: Medicare HMO | Source: Ambulatory Visit

## 2023-05-23 DIAGNOSIS — E349 Endocrine disorder, unspecified: Secondary | ICD-10-CM | POA: Diagnosis not present

## 2023-05-23 DIAGNOSIS — M8588 Other specified disorders of bone density and structure, other site: Secondary | ICD-10-CM | POA: Diagnosis not present

## 2023-05-23 DIAGNOSIS — N950 Postmenopausal bleeding: Secondary | ICD-10-CM | POA: Diagnosis not present

## 2023-05-23 DIAGNOSIS — S9031XA Contusion of right foot, initial encounter: Secondary | ICD-10-CM | POA: Diagnosis not present

## 2023-05-23 DIAGNOSIS — M79671 Pain in right foot: Secondary | ICD-10-CM | POA: Diagnosis not present

## 2023-05-23 DIAGNOSIS — M810 Age-related osteoporosis without current pathological fracture: Secondary | ICD-10-CM

## 2023-05-25 ENCOUNTER — Other Ambulatory Visit (HOSPITAL_COMMUNITY): Payer: Self-pay

## 2023-05-25 DIAGNOSIS — E6609 Other obesity due to excess calories: Secondary | ICD-10-CM | POA: Diagnosis not present

## 2023-05-25 DIAGNOSIS — I10 Essential (primary) hypertension: Secondary | ICD-10-CM | POA: Diagnosis not present

## 2023-05-25 DIAGNOSIS — Z6830 Body mass index (BMI) 30.0-30.9, adult: Secondary | ICD-10-CM | POA: Diagnosis not present

## 2023-05-25 DIAGNOSIS — E785 Hyperlipidemia, unspecified: Secondary | ICD-10-CM | POA: Diagnosis not present

## 2023-05-25 DIAGNOSIS — R7303 Prediabetes: Secondary | ICD-10-CM | POA: Diagnosis not present

## 2023-05-25 DIAGNOSIS — E039 Hypothyroidism, unspecified: Secondary | ICD-10-CM | POA: Diagnosis not present

## 2023-05-25 DIAGNOSIS — G4733 Obstructive sleep apnea (adult) (pediatric): Secondary | ICD-10-CM | POA: Diagnosis not present

## 2023-05-25 MED ORDER — OZEMPIC (0.25 OR 0.5 MG/DOSE) 2 MG/3ML ~~LOC~~ SOPN
0.5000 mg | PEN_INJECTOR | SUBCUTANEOUS | 0 refills | Status: DC
  Filled 2023-05-25 – 2023-06-16 (×2): qty 3, 28d supply, fill #0

## 2023-06-01 DIAGNOSIS — N3941 Urge incontinence: Secondary | ICD-10-CM | POA: Diagnosis not present

## 2023-06-01 DIAGNOSIS — R350 Frequency of micturition: Secondary | ICD-10-CM | POA: Diagnosis not present

## 2023-06-16 ENCOUNTER — Other Ambulatory Visit (HOSPITAL_COMMUNITY): Payer: Self-pay

## 2023-06-17 ENCOUNTER — Other Ambulatory Visit (HOSPITAL_COMMUNITY): Payer: Self-pay

## 2023-06-21 DIAGNOSIS — K219 Gastro-esophageal reflux disease without esophagitis: Secondary | ICD-10-CM | POA: Diagnosis not present

## 2023-06-21 DIAGNOSIS — Z0000 Encounter for general adult medical examination without abnormal findings: Secondary | ICD-10-CM | POA: Diagnosis not present

## 2023-06-21 DIAGNOSIS — Z1159 Encounter for screening for other viral diseases: Secondary | ICD-10-CM | POA: Diagnosis not present

## 2023-06-21 DIAGNOSIS — E559 Vitamin D deficiency, unspecified: Secondary | ICD-10-CM | POA: Diagnosis not present

## 2023-06-21 DIAGNOSIS — R7309 Other abnormal glucose: Secondary | ICD-10-CM | POA: Diagnosis not present

## 2023-06-21 DIAGNOSIS — H35039 Hypertensive retinopathy, unspecified eye: Secondary | ICD-10-CM | POA: Diagnosis not present

## 2023-06-21 DIAGNOSIS — J309 Allergic rhinitis, unspecified: Secondary | ICD-10-CM | POA: Diagnosis not present

## 2023-06-21 DIAGNOSIS — E039 Hypothyroidism, unspecified: Secondary | ICD-10-CM | POA: Diagnosis not present

## 2023-06-21 DIAGNOSIS — Z23 Encounter for immunization: Secondary | ICD-10-CM | POA: Diagnosis not present

## 2023-06-21 DIAGNOSIS — E785 Hyperlipidemia, unspecified: Secondary | ICD-10-CM | POA: Diagnosis not present

## 2023-06-21 DIAGNOSIS — J4520 Mild intermittent asthma, uncomplicated: Secondary | ICD-10-CM | POA: Diagnosis not present

## 2023-06-21 DIAGNOSIS — I10 Essential (primary) hypertension: Secondary | ICD-10-CM | POA: Diagnosis not present

## 2023-06-21 DIAGNOSIS — Z1331 Encounter for screening for depression: Secondary | ICD-10-CM | POA: Diagnosis not present

## 2023-06-21 DIAGNOSIS — G4733 Obstructive sleep apnea (adult) (pediatric): Secondary | ICD-10-CM | POA: Diagnosis not present

## 2023-06-21 DIAGNOSIS — M1990 Unspecified osteoarthritis, unspecified site: Secondary | ICD-10-CM | POA: Diagnosis not present

## 2023-06-21 DIAGNOSIS — F339 Major depressive disorder, recurrent, unspecified: Secondary | ICD-10-CM | POA: Diagnosis not present

## 2023-06-23 DIAGNOSIS — E785 Hyperlipidemia, unspecified: Secondary | ICD-10-CM | POA: Diagnosis not present

## 2023-06-23 DIAGNOSIS — E6609 Other obesity due to excess calories: Secondary | ICD-10-CM | POA: Diagnosis not present

## 2023-06-23 DIAGNOSIS — E039 Hypothyroidism, unspecified: Secondary | ICD-10-CM | POA: Diagnosis not present

## 2023-06-23 DIAGNOSIS — G4733 Obstructive sleep apnea (adult) (pediatric): Secondary | ICD-10-CM | POA: Diagnosis not present

## 2023-06-23 DIAGNOSIS — Z6830 Body mass index (BMI) 30.0-30.9, adult: Secondary | ICD-10-CM | POA: Diagnosis not present

## 2023-06-23 DIAGNOSIS — I10 Essential (primary) hypertension: Secondary | ICD-10-CM | POA: Diagnosis not present

## 2023-06-23 DIAGNOSIS — R7303 Prediabetes: Secondary | ICD-10-CM | POA: Diagnosis not present

## 2023-06-25 ENCOUNTER — Other Ambulatory Visit (HOSPITAL_COMMUNITY): Payer: Self-pay

## 2023-06-25 MED ORDER — OZEMPIC (0.25 OR 0.5 MG/DOSE) 2 MG/3ML ~~LOC~~ SOPN
0.5000 mg | PEN_INJECTOR | SUBCUTANEOUS | 0 refills | Status: DC
  Filled 2023-06-25 – 2023-07-25 (×2): qty 3, 28d supply, fill #0

## 2023-07-25 ENCOUNTER — Other Ambulatory Visit (HOSPITAL_COMMUNITY): Payer: Self-pay

## 2023-07-29 ENCOUNTER — Other Ambulatory Visit (HOSPITAL_COMMUNITY): Payer: Self-pay

## 2023-08-15 DIAGNOSIS — H401111 Primary open-angle glaucoma, right eye, mild stage: Secondary | ICD-10-CM | POA: Diagnosis not present

## 2023-08-15 DIAGNOSIS — H401122 Primary open-angle glaucoma, left eye, moderate stage: Secondary | ICD-10-CM | POA: Diagnosis not present

## 2023-08-15 DIAGNOSIS — H353132 Nonexudative age-related macular degeneration, bilateral, intermediate dry stage: Secondary | ICD-10-CM | POA: Diagnosis not present

## 2023-08-15 DIAGNOSIS — D3131 Benign neoplasm of right choroid: Secondary | ICD-10-CM | POA: Diagnosis not present

## 2023-08-15 DIAGNOSIS — H524 Presbyopia: Secondary | ICD-10-CM | POA: Diagnosis not present

## 2023-08-21 DIAGNOSIS — N3001 Acute cystitis with hematuria: Secondary | ICD-10-CM | POA: Diagnosis not present

## 2023-08-24 DIAGNOSIS — R399 Unspecified symptoms and signs involving the genitourinary system: Secondary | ICD-10-CM | POA: Diagnosis not present

## 2023-09-01 ENCOUNTER — Other Ambulatory Visit (HOSPITAL_COMMUNITY): Payer: Self-pay

## 2023-09-01 MED ORDER — OZEMPIC (1 MG/DOSE) 4 MG/3ML ~~LOC~~ SOPN
1.0000 mg | PEN_INJECTOR | SUBCUTANEOUS | 0 refills | Status: DC
  Filled 2023-09-01: qty 3, 28d supply, fill #0

## 2023-09-02 ENCOUNTER — Other Ambulatory Visit (HOSPITAL_COMMUNITY): Payer: Self-pay

## 2023-09-03 ENCOUNTER — Other Ambulatory Visit (HOSPITAL_COMMUNITY): Payer: Self-pay

## 2023-09-26 ENCOUNTER — Other Ambulatory Visit (HOSPITAL_COMMUNITY): Payer: Self-pay

## 2023-10-05 DIAGNOSIS — E785 Hyperlipidemia, unspecified: Secondary | ICD-10-CM | POA: Diagnosis not present

## 2023-10-05 DIAGNOSIS — G4733 Obstructive sleep apnea (adult) (pediatric): Secondary | ICD-10-CM | POA: Diagnosis not present

## 2023-10-05 DIAGNOSIS — E6609 Other obesity due to excess calories: Secondary | ICD-10-CM | POA: Diagnosis not present

## 2023-10-05 DIAGNOSIS — I10 Essential (primary) hypertension: Secondary | ICD-10-CM | POA: Diagnosis not present

## 2023-10-05 DIAGNOSIS — E039 Hypothyroidism, unspecified: Secondary | ICD-10-CM | POA: Diagnosis not present

## 2023-10-05 DIAGNOSIS — R7303 Prediabetes: Secondary | ICD-10-CM | POA: Diagnosis not present

## 2023-10-05 DIAGNOSIS — Z6829 Body mass index (BMI) 29.0-29.9, adult: Secondary | ICD-10-CM | POA: Diagnosis not present

## 2023-10-06 ENCOUNTER — Other Ambulatory Visit (HOSPITAL_COMMUNITY): Payer: Self-pay

## 2023-10-06 MED ORDER — OZEMPIC (1 MG/DOSE) 4 MG/3ML ~~LOC~~ SOPN
1.0000 mg | PEN_INJECTOR | SUBCUTANEOUS | 0 refills | Status: DC
Start: 2023-10-05 — End: 2024-03-05
  Filled 2023-10-06 – 2023-10-21 (×2): qty 3, 28d supply, fill #0

## 2023-10-15 ENCOUNTER — Other Ambulatory Visit (HOSPITAL_COMMUNITY): Payer: Self-pay

## 2023-10-21 ENCOUNTER — Other Ambulatory Visit (HOSPITAL_COMMUNITY): Payer: Self-pay

## 2023-10-25 ENCOUNTER — Other Ambulatory Visit (HOSPITAL_COMMUNITY): Payer: Self-pay

## 2023-10-25 ENCOUNTER — Other Ambulatory Visit: Payer: Self-pay

## 2023-10-26 ENCOUNTER — Other Ambulatory Visit (HOSPITAL_COMMUNITY): Payer: Self-pay

## 2023-11-09 ENCOUNTER — Ambulatory Visit: Payer: Medicare HMO | Admitting: Internal Medicine

## 2023-11-15 DIAGNOSIS — Z6829 Body mass index (BMI) 29.0-29.9, adult: Secondary | ICD-10-CM | POA: Diagnosis not present

## 2023-11-15 DIAGNOSIS — R7303 Prediabetes: Secondary | ICD-10-CM | POA: Diagnosis not present

## 2023-11-15 DIAGNOSIS — E039 Hypothyroidism, unspecified: Secondary | ICD-10-CM | POA: Diagnosis not present

## 2023-11-15 DIAGNOSIS — E785 Hyperlipidemia, unspecified: Secondary | ICD-10-CM | POA: Diagnosis not present

## 2023-11-15 DIAGNOSIS — E6609 Other obesity due to excess calories: Secondary | ICD-10-CM | POA: Diagnosis not present

## 2023-11-15 DIAGNOSIS — I10 Essential (primary) hypertension: Secondary | ICD-10-CM | POA: Diagnosis not present

## 2023-11-15 DIAGNOSIS — G4733 Obstructive sleep apnea (adult) (pediatric): Secondary | ICD-10-CM | POA: Diagnosis not present

## 2023-11-16 ENCOUNTER — Other Ambulatory Visit (HOSPITAL_COMMUNITY): Payer: Self-pay

## 2023-11-16 MED ORDER — MOUNJARO 2.5 MG/0.5ML ~~LOC~~ SOAJ
SUBCUTANEOUS | 0 refills | Status: DC
Start: 2023-11-15 — End: 2024-04-11
  Filled 2023-11-16: qty 2, 30d supply, fill #0
  Filled 2023-11-17: qty 2, 28d supply, fill #0

## 2023-11-17 ENCOUNTER — Other Ambulatory Visit (HOSPITAL_COMMUNITY): Payer: Self-pay

## 2023-11-18 ENCOUNTER — Other Ambulatory Visit (HOSPITAL_COMMUNITY): Payer: Self-pay

## 2023-11-24 ENCOUNTER — Other Ambulatory Visit (HOSPITAL_COMMUNITY): Payer: Self-pay

## 2023-11-24 MED ORDER — ZEPBOUND 2.5 MG/0.5ML ~~LOC~~ SOAJ
SUBCUTANEOUS | 0 refills | Status: DC
  Filled 2023-11-24: qty 2, 30d supply, fill #0
  Filled 2024-01-02: qty 2, 28d supply, fill #0

## 2023-11-29 ENCOUNTER — Other Ambulatory Visit (HOSPITAL_COMMUNITY): Payer: Self-pay

## 2023-12-08 ENCOUNTER — Other Ambulatory Visit (HOSPITAL_COMMUNITY): Payer: Self-pay

## 2023-12-09 ENCOUNTER — Other Ambulatory Visit (HOSPITAL_COMMUNITY): Payer: Self-pay

## 2023-12-12 ENCOUNTER — Other Ambulatory Visit (HOSPITAL_COMMUNITY): Payer: Self-pay

## 2023-12-15 ENCOUNTER — Other Ambulatory Visit (HOSPITAL_COMMUNITY): Payer: Self-pay

## 2023-12-20 DIAGNOSIS — E039 Hypothyroidism, unspecified: Secondary | ICD-10-CM | POA: Diagnosis not present

## 2023-12-20 DIAGNOSIS — I10 Essential (primary) hypertension: Secondary | ICD-10-CM | POA: Diagnosis not present

## 2023-12-22 ENCOUNTER — Other Ambulatory Visit (HOSPITAL_COMMUNITY): Payer: Self-pay

## 2024-01-02 ENCOUNTER — Other Ambulatory Visit (HOSPITAL_COMMUNITY): Payer: Self-pay

## 2024-01-13 ENCOUNTER — Other Ambulatory Visit (HOSPITAL_COMMUNITY): Payer: Self-pay

## 2024-01-13 DIAGNOSIS — G8929 Other chronic pain: Secondary | ICD-10-CM | POA: Diagnosis not present

## 2024-01-13 DIAGNOSIS — Z9889 Other specified postprocedural states: Secondary | ICD-10-CM | POA: Diagnosis not present

## 2024-01-13 DIAGNOSIS — J309 Allergic rhinitis, unspecified: Secondary | ICD-10-CM | POA: Diagnosis not present

## 2024-01-13 DIAGNOSIS — M546 Pain in thoracic spine: Secondary | ICD-10-CM | POA: Diagnosis not present

## 2024-01-22 DIAGNOSIS — N3001 Acute cystitis with hematuria: Secondary | ICD-10-CM | POA: Diagnosis not present

## 2024-01-25 DIAGNOSIS — I10 Essential (primary) hypertension: Secondary | ICD-10-CM | POA: Diagnosis not present

## 2024-01-25 DIAGNOSIS — G4733 Obstructive sleep apnea (adult) (pediatric): Secondary | ICD-10-CM | POA: Diagnosis not present

## 2024-02-01 DIAGNOSIS — M79644 Pain in right finger(s): Secondary | ICD-10-CM | POA: Diagnosis not present

## 2024-02-01 DIAGNOSIS — J0101 Acute recurrent maxillary sinusitis: Secondary | ICD-10-CM | POA: Diagnosis not present

## 2024-02-01 DIAGNOSIS — S6991XA Unspecified injury of right wrist, hand and finger(s), initial encounter: Secondary | ICD-10-CM | POA: Diagnosis not present

## 2024-02-06 DIAGNOSIS — Z79899 Other long term (current) drug therapy: Secondary | ICD-10-CM | POA: Diagnosis not present

## 2024-02-06 DIAGNOSIS — R270 Ataxia, unspecified: Secondary | ICD-10-CM | POA: Diagnosis not present

## 2024-02-07 ENCOUNTER — Other Ambulatory Visit (HOSPITAL_COMMUNITY): Payer: Self-pay

## 2024-02-07 MED ORDER — ZEPBOUND 5 MG/0.5ML ~~LOC~~ SOAJ
5.0000 mg | SUBCUTANEOUS | 0 refills | Status: DC
  Filled 2024-02-07 – 2024-03-01 (×2): qty 2, 28d supply, fill #0

## 2024-02-15 ENCOUNTER — Other Ambulatory Visit (HOSPITAL_COMMUNITY): Payer: Self-pay

## 2024-02-17 ENCOUNTER — Other Ambulatory Visit (HOSPITAL_COMMUNITY): Payer: Self-pay

## 2024-02-24 DIAGNOSIS — M545 Low back pain, unspecified: Secondary | ICD-10-CM | POA: Diagnosis not present

## 2024-03-01 ENCOUNTER — Other Ambulatory Visit (HOSPITAL_COMMUNITY): Payer: Self-pay

## 2024-03-05 ENCOUNTER — Ambulatory Visit (INDEPENDENT_AMBULATORY_CARE_PROVIDER_SITE_OTHER): Admitting: Allergy and Immunology

## 2024-03-05 ENCOUNTER — Encounter: Payer: Self-pay | Admitting: Allergy and Immunology

## 2024-03-05 VITALS — BP 152/82 | HR 56 | Resp 20 | Ht 65.4 in | Wt 197.4 lb

## 2024-03-05 DIAGNOSIS — K219 Gastro-esophageal reflux disease without esophagitis: Secondary | ICD-10-CM

## 2024-03-05 DIAGNOSIS — J4520 Mild intermittent asthma, uncomplicated: Secondary | ICD-10-CM | POA: Diagnosis not present

## 2024-03-05 DIAGNOSIS — J3089 Other allergic rhinitis: Secondary | ICD-10-CM | POA: Diagnosis not present

## 2024-03-05 MED ORDER — ALBUTEROL SULFATE HFA 108 (90 BASE) MCG/ACT IN AERS: INHALATION_SPRAY | RESPIRATORY_TRACT | 1 refills | Status: DC

## 2024-03-05 MED ORDER — FAMOTIDINE 40 MG PO TABS: 40.0000 mg | ORAL_TABLET | Freq: Every day | ORAL | 5 refills | Status: DC

## 2024-03-05 NOTE — Progress Notes (Signed)
 [place] - [place] - [place] - [place]   Dear [place]  Annabell Key,  Thank you for referring [name] to the [facility] [place]  on 03/05/2024.   Below is a summation of this patient's evaluation and recommendations.  Thank you for your referral. I will keep you informed about this patient's response to treatment.   If you have any questions please do not hesitate to contact me.   Sincerely,  Fabienne Holter, MD Allergy / Immunology [facility] [place]    ______________________________________________________________________    NEW PATIENT NOTE  Referring Provider: Floydene Hy, PA Primary Provider: Hortencia Lyon, PA Date of office visit: 03/05/2024    Subjective:   Chief Complaint:  [name] (DOB: 1946-07-29) is a 78 y.o. female who presents to the clinic on 03/05/2024 with a chief complaint of No chief complaint on file. Aaron Aas     HPI: [name] presents to this clinic in evaluation of respiratory tract issues.  I had seen her in this clinic in 2018 for an issue tied up with mild intermittent asthma, allergic rhinitis, LPR.  She states that every winter she has a difficult time with her airway.  For example, this past winter, January, February, March, she had unrelenting coughing with spells of cough with throat clearing and spitting and postnasal drip along with some sneezing and some itchy eyes and some occasional raspy voice.  She states that she was treated with multiple antibiotics for the diagnosis of sinusitis and bronchitis and given 3 systemic steroids during this timeframe.  Currently, she still has some cough and some throat clearing and some drainage.  She does not really have a tremendous amount of upper airway symptoms but occasionally some itchy eyes.  She can smell and taste without any problem, does not have any ugly nasal discharge, does not make any sputum,  does not have any chest pain.  She does not think that she has been wheezing and she has not really had chest tightness or shortness of breath.  She does have reflux disease.  She will have heartburn into her mid chest region.  She has been prescribed pantoprazole  every day but she only uses pantoprazole  about every third day.  She drinks 2 coffees in the morning and 2 coffees in the evening and does not really have much chocolate.  She thinks her last chest x-ray was 2023 or 2024.  Past Medical History:  Diagnosis Date   Anxiety    Asthma    GERD (gastroesophageal reflux disease)    Glaucoma    History of kidney stones    Hyperlipidemia    Hypertension    Macular degeneration    Osteopenia    Sleep apnea    Home test via [facility], Dr. Cherlyn Cornet ordered, wears Cpap nightly   Thyroid  disease     Past Surgical History:  Procedure Laterality Date   APPENDECTOMY     CESAREAN SECTION     FOREARM SURGERY     KYPHOPLASTY N/A 12/25/2020   Procedure: THORACIC TWELVE KYPHOPLASTY;  Surgeon: Virl Grimes, MD;  Location: [facility] OR;  Service: Orthopedics;  Laterality: N/A;    Allergies as of 03/05/2024       Reactions   Ropinirole Hydrochloride Other (See Comments)   Crazy feeling   Sulfa Antibiotics Other (See Comments)   Sulfacetamide Sodium Other (See Comments)   Bad reaction many years ago - doesn't remember details  Penicillins Rash        Medication List    amLODipine 5 MG tablet Commonly known as: NORVASC Take 5 mg by mouth daily.   irbesartan-hydrochlorothiazide 150-12.5 MG tablet Commonly known as: AVALIDE Take 1 tablet by mouth daily.   latanoprost 0.005 % ophthalmic solution Commonly known as: XALATAN Place 1 drop into both eyes at bedtime.   levothyroxine  125 MCG tablet Commonly known as: SYNTHROID  Take 1 tablet (125 mcg total) by mouth daily.   metoprolol succinate 25 MG 24 hr tablet Commonly known as: TOPROL-XL Take 25 mg by mouth daily.   Mounjaro  2.5  MG/0.5ML Pen Generic drug: tirzepatide  Inject 2.5 mg Subcutaneous Once a week 30 days   pantoprazole  40 MG tablet Commonly known as: PROTONIX  Take 40 mg by mouth daily.   rosuvastatin 20 MG tablet Commonly known as: CRESTOR Take 20 mg by mouth daily.   Zepbound  5 MG/0.5ML Pen Generic drug: tirzepatide  Inject 5 mg into the skin once a week.    Review of systems negative except as noted in HPI / PMHx or noted below:  Review of Systems  Constitutional: Negative.   HENT: Negative.    Eyes: Negative.   Respiratory: Negative.    Cardiovascular: Negative.   Gastrointestinal: Negative.   Genitourinary: Negative.   Musculoskeletal: Negative.   Skin: Negative.   Neurological: Negative.   Endo/Heme/Allergies: Negative.   Psychiatric/Behavioral: Negative.      Family History  Problem Relation Age of Onset   Heart disease Mother    Hypertension Mother    Heart disease Father    Stroke Father    Hypertension Father     Social History   Socioeconomic History   Marital status: Married    Spouse name: Not on file   Number of children: Not on file   Years of education: Not on file   Highest education level: Not on file  Occupational History   Not on file  Tobacco Use   Smoking status: Never   Smokeless tobacco: Never  Vaping Use   Vaping status: Never Used  Substance and Sexual Activity   Alcohol use: No   Drug use: No   Sexual activity: Not on file  Other Topics Concern   Not on file  Social History Narrative   Not on file   Environmental and Social history  Lives in a house with a dry environment, dog located inside the household, carpet in the bedroom, no plastic on the bed, no plastic on the pillow, no smoking ongoing with inside the household.  Objective:   Vitals:   03/05/24 0931  BP: (!) 152/82  Pulse: (!) 56  Resp: 20  SpO2: 96%   Height: 5' 5.4" (166.1 cm) Weight: 197 lb 6.4 oz (89.5 kg)  Physical Exam Constitutional:      Appearance: She is  not diaphoretic.     Comments: Cough, throat clearing  HENT:     Head: Normocephalic.     Right Ear: Tympanic membrane, ear canal and external ear normal.     Left Ear: Tympanic membrane, ear canal and external ear normal.     Nose: Nose normal. No mucosal edema or rhinorrhea.     Mouth/Throat:     Pharynx: Uvula midline. No oropharyngeal exudate.  Eyes:     Conjunctiva/sclera: Conjunctivae normal.  Neck:     Thyroid : No thyromegaly.     Trachea: Trachea normal. No tracheal tenderness or tracheal deviation.  Cardiovascular:     Rate and Rhythm:  Normal rate and regular rhythm.     Heart sounds: Normal heart sounds, S1 normal and S2 normal. No murmur heard. Pulmonary:     Effort: No respiratory distress.     Breath sounds: Normal breath sounds. No stridor. No wheezing or rales.  Lymphadenopathy:     Head:     Right side of head: No tonsillar adenopathy.     Left side of head: No tonsillar adenopathy.     Cervical: No cervical adenopathy.  Skin:    Findings: No erythema or rash.     Nails: There is no clubbing.  Neurological:     Mental Status: She is alert.     Diagnostics: Allergy skin tests were performed.  She did not demonstrate any hypersensitivity against a screening panel of aeroallergens or foods.  Spirometry was performed and demonstrated an FEV1 of 2.04 @ 97 % of predicted. FEV1/FVC = 0.74   Assessment and Plan:    1. Asthma, mild intermittent, well-controlled   2. Other allergic rhinitis   3. LPRD (laryngopharyngeal reflux disease)    1. Allergen avoidance measures???  2. Treat and prevent airway inflammation:   A. OTC Nasacort  - 1 spray each nostril 1 time per day  3. Treat and prevent reflux induced airway inflammation:   A. Decrease caffeine consumption  B. Pantoprazole  40 mg - 1 tablet in AM  C. Famotidine  40 mg in PM  D. Replace throat clearing with swallowing/drinking maneuver  3. If needed:   A. Allegra 180 - 1 tablet 1 time per day  B.  Albuterol  - 2 inhalations every 4-6 hours (empty lungs)  4. Review previous chest x-ray  5. Return to clinic in 4 weeks or earlier if problem  [name] appears to have irritation of her airway and we will treat her with therapy directed against inflammation with some Nasacort  and therapy directed against reflux induced respiratory disease with a combination of pantoprazole  and famotidine .  I will see her back in this clinic in approximately 4 weeks or earlier if there is a problem.  Fabienne Holter, MD Allergy / Immunology [facility] [place] 

## 2024-03-05 NOTE — Patient Instructions (Addendum)
  1. Allergen avoidance measures???  2. Treat and prevent airway inflammation:   A. OTC Nasacort  - 1 spray each nostril 1 time per day  3. Treat and prevent reflux induced airway inflammation:   A. Decrease caffeine consumption  B. Pantoprazole  40 mg - 1 tablet in AM  C. Famotidine  40 mg in PM  D. Replace throat clearing with swallowing/drinking maneuver  4. If needed:   A. Allegra 180 - 1 tablet 1 time per day  B. Albuterol  - 2 inhalations every 4-6 hours (empty lungs)  5. Review previous chest x-ray  6. Return to clinic in 4 weeks or earlier if problem

## 2024-03-06 ENCOUNTER — Encounter: Payer: Self-pay | Admitting: Allergy and Immunology

## 2024-04-11 ENCOUNTER — Encounter: Payer: Self-pay | Admitting: Allergy and Immunology

## 2024-04-11 ENCOUNTER — Ambulatory Visit (INDEPENDENT_AMBULATORY_CARE_PROVIDER_SITE_OTHER): Admitting: Allergy and Immunology

## 2024-04-11 VITALS — BP 140/68 | HR 56 | Resp 16

## 2024-04-11 DIAGNOSIS — J3089 Other allergic rhinitis: Secondary | ICD-10-CM | POA: Diagnosis not present

## 2024-04-11 DIAGNOSIS — K219 Gastro-esophageal reflux disease without esophagitis: Secondary | ICD-10-CM

## 2024-04-11 DIAGNOSIS — J4520 Mild intermittent asthma, uncomplicated: Secondary | ICD-10-CM

## 2024-04-11 NOTE — Patient Instructions (Signed)
  1. Treat and prevent reflux induced airway inflammation:   A. Decrease caffeine consumption  B. Pantoprazole  40 mg - 1 tablet in AM  C. Famotidine  40 mg in PM IF NEEDED  D. Replace throat clearing with swallowing/drinking maneuver  2. If needed:   A. Allegra 180 - 1 tablet 1 time per day  B. Albuterol  - 2 inhalations every 4-6 hours (empty lungs)  C. OTC Nasacort  - 1 spray each nostril 1 time per day  3. Return to clinic in 6 months or earlier if problem  4. Influenza = Tamiflu. Covid = Paxlovid

## 2024-04-11 NOTE — Progress Notes (Unsigned)
 [name] - [name] - [name] - [name] - [name]   Follow-up Note  Referring Provider: Cleotilde, [name]  E, PA Primary Provider: Cleotilde [name] BRAVO, PA Date of Office Visit: 04/11/2024  Subjective:   [name] (DOB: [birth year]) is a 78 y.o. female who [facility] 04[facility]11/2024 in re-evaluation of the following:  HPI: [name] returns to this clinic in evaluation of intermittent asthma, allergic rhinitis, LPR.  I last saw her in this clinic 05 Mar 2024.  She has had a dramatic response to the medical therapy and has basically resolved all of her respiratory tract issues.  Her cough is gone her throat clearing is gone, her drainage is gone, she has no issues with her nose,  She has decreased her caffeine by about 50%.  She continues to use a proton pump inhibitor and a H2 receptor blocker.  Although she initially did use the nasal steroid she has no need to do that agents at this Robles in time as she has no nasal symptoms.  Allergies as of 04/11/2024       Reactions   Ropinirole Hydrochloride Other (See Comments)   Crazy feeling   Sulfa Antibiotics Other (See Comments)   Sulfacetamide Sodium Other (See Comments)   Bad reaction many years ago - doesn't remember details   Penicillins Rash        Medication List     albuterol  108 (90 Base) MCG/ACT inhaler Commonly known as: VENTOLIN  HFA Can inhale two puffs every four to six hours as needed for cough, wheeze, shortness of breath.   amLODipine 5 MG tablet Commonly known as: NORVASC Take 5 mg by mouth daily.   B COMPLEX PO Take by mouth daily.   CALCIUM PO Take by mouth.   famotidine  40 MG tablet Commonly known as: PEPCID  Take 1 tablet (40 mg total) by mouth at bedtime.   irbesartan-hydrochlorothiazide 150-12.5 MG tablet Commonly known as: AVALIDE Take 1 tablet by mouth daily.   latanoprost 0.005 % ophthalmic solution Commonly known as: XALATAN Place 1 drop into both eyes  at bedtime.   levothyroxine  125 MCG tablet Commonly known as: SYNTHROID  Take 1 tablet (125 mcg total) by mouth daily.   metoprolol succinate 25 MG 24 hr tablet Commonly known as: TOPROL-XL Take 25 mg by mouth daily.   pantoprazole  40 MG tablet Commonly known as: PROTONIX  Take 40 mg by mouth daily.   rosuvastatin 20 MG tablet Commonly known as: CRESTOR Take 20 mg by mouth daily.    Past Medical History:  Diagnosis Date   Anxiety    Asthma    GERD (gastroesophageal reflux disease)    Glaucoma    History of kidney stones    Hyperlipidemia    Hypertension    Macular degeneration    Osteopenia    Sleep apnea    Home test via [facility], Dr. Tammy ordered, wears Cpap nightly   Thyroid  disease     Past Surgical History:  Procedure Laterality Date   APPENDECTOMY     CESAREAN SECTION     FOREARM SURGERY     KYPHOPLASTY N/A 12/25/2020   Procedure: THORACIC TWELVE KYPHOPLASTY;  Surgeon: Beuford Anes, MD;  Location: [facility] OR;  Service: Orthopedics;  Laterality: N/A;    Review of systems negative except as noted in HPI / PMHx or noted below:  Review of Systems  Constitutional: Negative.   HENT: Negative.    Eyes: Negative.   Respiratory: Negative.    Cardiovascular:  Negative.   Gastrointestinal: Negative.   Genitourinary: Negative.   Musculoskeletal: Negative.   Skin: Negative.   Neurological: Negative.   Endo/Heme/Allergies: Negative.   Psychiatric/Behavioral: Negative.       Objective:   Vitals:   04/11/24 1017  BP: (!) 140/68  Pulse: (!) 56  Resp: 16  SpO2: 95%          Physical Exam Constitutional:      Appearance: She is not diaphoretic.  HENT:     Head: Normocephalic.     Right Ear: Tympanic membrane, ear canal and external ear normal.     Left Ear: Tympanic membrane, ear canal and external ear normal.     Nose: Nose normal. No mucosal edema or rhinorrhea.     Mouth/Throat:     Pharynx: Uvula midline. No oropharyngeal exudate.   Eyes:      Conjunctiva/sclera: Conjunctivae normal.   Neck:     Thyroid : No thyromegaly.     Trachea: Trachea normal. No tracheal tenderness or tracheal deviation.   Cardiovascular:     Rate and Rhythm: Normal rate and regular rhythm.     Heart sounds: Normal heart sounds, S1 normal and S2 normal. No murmur heard. Pulmonary:     Effort: No respiratory distress.     Breath sounds: Normal breath sounds. No stridor. No wheezing or rales.  Lymphadenopathy:     Head:     Right side of head: No tonsillar adenopathy.     Left side of head: No tonsillar adenopathy.     Cervical: No cervical adenopathy.   Skin:    Findings: No erythema or rash.     Nails: There is no clubbing.   Neurological:     Mental Status: She is alert.     Diagnostics: none  Assessment and Plan:   1. Asthma, mild intermittent, well-controlled   2. Other allergic rhinitis   3. LPRD (laryngopharyngeal reflux disease)    1. Treat and prevent reflux induced airway inflammation:   A. Decrease caffeine consumption  B. Pantoprazole  40 mg - 1 tablet in AM  C. Famotidine  40 mg in PM IF NEEDED  D. Replace throat clearing with swallowing/drinking maneuver  2. If needed:   A. Allegra 180 - 1 tablet 1 time per day  B. Albuterol  - 2 inhalations every 4-6 hours (empty lungs)  C. OTC Nasacort  - 1 spray each nostril 1 time per day  3. Return to clinic in 6 months or earlier if problem  4. Influenza = Tamiflu. Covid = Paxlovid  [name] is really doing very well and she will continue to treat LPR although we will attempt to have her discontinue her famotidine  and just rely on the use of her proton pump inhibitor.  There are selection of agents that she can utilize should they be required as noted above.  Assuming she does well with this plan I will see her back in this clinic in 6 months or earlier if there is a problem.  Camellia Denis, MD Allergy  / Immunology [facility]

## 2024-04-12 ENCOUNTER — Encounter: Payer: Self-pay | Admitting: Allergy and Immunology

## 2024-04-30 ENCOUNTER — Encounter: Payer: Self-pay | Admitting: *Deleted

## 2024-05-17 DIAGNOSIS — H401111 Primary open-angle glaucoma, right eye, mild stage: Secondary | ICD-10-CM | POA: Diagnosis not present

## 2024-05-17 DIAGNOSIS — E669 Obesity, unspecified: Secondary | ICD-10-CM | POA: Diagnosis not present

## 2024-05-17 DIAGNOSIS — F411 Generalized anxiety disorder: Secondary | ICD-10-CM | POA: Diagnosis not present

## 2024-05-17 DIAGNOSIS — F339 Major depressive disorder, recurrent, unspecified: Secondary | ICD-10-CM | POA: Diagnosis not present

## 2024-06-17 DIAGNOSIS — F411 Generalized anxiety disorder: Secondary | ICD-10-CM | POA: Diagnosis not present

## 2024-06-17 DIAGNOSIS — H401111 Primary open-angle glaucoma, right eye, mild stage: Secondary | ICD-10-CM | POA: Diagnosis not present

## 2024-06-17 DIAGNOSIS — E669 Obesity, unspecified: Secondary | ICD-10-CM | POA: Diagnosis not present

## 2024-06-17 DIAGNOSIS — F339 Major depressive disorder, recurrent, unspecified: Secondary | ICD-10-CM | POA: Diagnosis not present

## 2024-07-01 DIAGNOSIS — R300 Dysuria: Secondary | ICD-10-CM | POA: Diagnosis not present

## 2024-07-24 DIAGNOSIS — Z0000 Encounter for general adult medical examination without abnormal findings: Secondary | ICD-10-CM | POA: Diagnosis not present

## 2024-07-24 DIAGNOSIS — Z1331 Encounter for screening for depression: Secondary | ICD-10-CM | POA: Diagnosis not present

## 2024-07-24 DIAGNOSIS — Z23 Encounter for immunization: Secondary | ICD-10-CM | POA: Diagnosis not present

## 2024-07-25 ENCOUNTER — Other Ambulatory Visit (HOSPITAL_COMMUNITY): Payer: Self-pay | Admitting: Internal Medicine

## 2024-07-25 DIAGNOSIS — R0609 Other forms of dyspnea: Secondary | ICD-10-CM

## 2024-08-01 DIAGNOSIS — Z0120 Encounter for dental examination and cleaning without abnormal findings: Secondary | ICD-10-CM | POA: Diagnosis not present

## 2024-08-08 DIAGNOSIS — M7918 Myalgia, other site: Secondary | ICD-10-CM | POA: Diagnosis not present

## 2024-08-08 DIAGNOSIS — M5459 Other low back pain: Secondary | ICD-10-CM | POA: Diagnosis not present

## 2024-08-09 ENCOUNTER — Ambulatory Visit (HOSPITAL_COMMUNITY)
Admission: RE | Admit: 2024-08-09 | Discharge: 2024-08-09 | Disposition: A | Discharge status: Home / Self Care | Source: Ambulatory Visit | Attending: Internal Medicine | Admitting: Internal Medicine

## 2024-08-09 DIAGNOSIS — I082 Rheumatic disorders of both aortic and tricuspid valves: Secondary | ICD-10-CM | POA: Insufficient documentation

## 2024-08-09 DIAGNOSIS — R0609 Other forms of dyspnea: Secondary | ICD-10-CM | POA: Insufficient documentation

## 2024-08-09 LAB — ECHOCARDIOGRAM COMPLETE
Area-P 1/2: 2.44 cm2
Calc EF: 51.5 %
S' Lateral: 4.3 cm
Single Plane A2C EF: 46.7 %
Single Plane A4C EF: 53.2 %

## 2024-09-06 DIAGNOSIS — H401111 Primary open-angle glaucoma, right eye, mild stage: Secondary | ICD-10-CM | POA: Diagnosis not present

## 2024-09-06 DIAGNOSIS — H01004 Unspecified blepharitis left upper eyelid: Secondary | ICD-10-CM | POA: Diagnosis not present

## 2024-09-06 DIAGNOSIS — H401122 Primary open-angle glaucoma, left eye, moderate stage: Secondary | ICD-10-CM | POA: Diagnosis not present

## 2024-09-06 DIAGNOSIS — H01001 Unspecified blepharitis right upper eyelid: Secondary | ICD-10-CM | POA: Diagnosis not present

## 2024-09-18 ENCOUNTER — Telehealth: Payer: Self-pay | Admitting: *Deleted

## 2024-09-18 ENCOUNTER — Ambulatory Visit: Attending: Cardiology | Admitting: Cardiology

## 2024-09-18 ENCOUNTER — Encounter: Payer: Self-pay | Admitting: Cardiology

## 2024-09-18 VITALS — BP 154/85 | HR 56 | Resp 16 | Ht 65.0 in | Wt 206.6 lb

## 2024-09-18 DIAGNOSIS — R0609 Other forms of dyspnea: Secondary | ICD-10-CM | POA: Diagnosis not present

## 2024-09-18 DIAGNOSIS — G4733 Obstructive sleep apnea (adult) (pediatric): Secondary | ICD-10-CM | POA: Diagnosis not present

## 2024-09-18 DIAGNOSIS — I10 Essential (primary) hypertension: Secondary | ICD-10-CM | POA: Diagnosis not present

## 2024-09-18 DIAGNOSIS — E782 Mixed hyperlipidemia: Secondary | ICD-10-CM | POA: Diagnosis not present

## 2024-09-18 MED ORDER — METOPROLOL SUCCINATE ER 25 MG PO TB24: 25.0000 mg | ORAL_TABLET | Freq: Every morning | ORAL | 3 refills | Status: AC

## 2024-09-18 MED ORDER — HYDROCHLOROTHIAZIDE 25 MG PO TABS: 25.0000 mg | ORAL_TABLET | Freq: Every morning | ORAL | 3 refills | Status: AC

## 2024-09-18 MED ORDER — IRBESARTAN 300 MG PO TABS: 300.0000 mg | ORAL_TABLET | Freq: Every day | ORAL | 3 refills | Status: AC

## 2024-09-18 NOTE — Telephone Encounter (Signed)
 Called spoke to patient .   RN gave patient f/u appointment with Dr Michele on 11/03/23 at 10:40 am   Appointment reminder mailed to patient .  Patient verbalized understanding.

## 2024-09-18 NOTE — Progress Notes (Signed)
 Cardiology Office Note:    Date:  09/18/2024  NAME:  [name]    MRN: [id number] DOB:  [birth year]   PCP:  Kimberly Lorella BRAVO, PA  Former Cardiology Providers: N/A Primary Cardiologist:  Madonna Large, DO, [facility] (established care 09/18/2024) Electrophysiologist:  None   Referring MD: Kimberly, [place]  E, PA  Reason of Consult: Exertional dyspnea/grade 2 diastolic dysfunction  Chief Complaint  Patient presents with   Shortness of Breath   New Patient (Initial Visit)    History of Present Illness:    [name] is a 78 y.o. Caucasian female whose past medical history and cardiovascular risk factors includes: Hypertension, obesity, obstructive sleep apnea compliant with CPAP, GERD, hypothyroidism, hyperlipidemia, renal artery stenosis follows with nephrology (per primary documentation).  She is being seen today for the evaluation of dyspnea on exertion at the request of Kimberly, [place]  E, PA.  She has had progressive exertional shortness of breath over several years, most noticeable with routine activities such as walking and dressing, and climbing stairs. She has no orthopnea, paroxysmal nocturnal dyspnea, chest pain, or peripheral edema.  Her medications include metoprolol 50 mg daily, amlodipine 5 mg, and irbesartan /hydrochlorothiazide, all taken in the afternoon. She uses CPAP most nights. She does not monitor blood pressure or pulse at home.  She goes to the gym three times a week and walks about a mile and rides a bike but develops dyspnea with these activities. She has no claudication (referral notes mentioned PVD - patient unaware).   Current Medications: Current Meds  Medication Sig   B Complex Vitamins (B COMPLEX PO) Take by mouth daily.   CALCIUM PO Take by mouth.   hydrochlorothiazide (HYDRODIURIL) 25 MG tablet Take 1 tablet (25 mg total) by mouth in the morning.   irbesartan (AVAPRO) 300 MG tablet Take 1 tablet (300 mg total) by mouth at bedtime.    latanoprost (XALATAN) 0.005 % ophthalmic solution Place 1 drop into both eyes at bedtime.   levothyroxine  (SYNTHROID , LEVOTHROID) 125 MCG tablet Take 1 tablet (125 mcg total) by mouth daily.   metoprolol succinate (TOPROL XL) 25 MG 24 hr tablet Take 1 tablet (25 mg total) by mouth in the morning.   OVER THE COUNTER MEDICATION Take 1 capsule by mouth daily. (Patient taking differently: Take 1 capsule by mouth daily. Advanced eye health complex)   pantoprazole  (PROTONIX ) 40 MG tablet Take 40 mg by mouth daily.   rosuvastatin (CRESTOR) 20 MG tablet Take 20 mg by mouth daily. (Patient taking differently: Take 20 mg by mouth once a week.)   [DISCONTINUED] amLODipine (NORVASC) 5 MG tablet Take 5 mg by mouth daily.   [DISCONTINUED] irbesartan-hydrochlorothiazide (AVALIDE) 150-12.5 MG tablet Take 1 tablet by mouth daily.   [DISCONTINUED] metoprolol succinate (TOPROL-XL) 50 MG 24 hr tablet Take 50 mg by mouth daily.     Allergies:    Ropinirole hydrochloride, Sulfa antibiotics, Sulfacetamide sodium, and Penicillins   Past Medical History: Past Medical History:  Diagnosis Date   Anxiety    Asthma    GERD (gastroesophageal reflux disease)    Glaucoma    History of kidney stones    Hyperlipidemia    Hypertension    Macular degeneration    Osteopenia    Sleep apnea    Home test via [facility], Dr. Tammy ordered, wears Cpap nightly   Thyroid  disease     Past Surgical History: Past Surgical History:  Procedure Laterality Date   APPENDECTOMY     CESAREAN  SECTION     FOREARM SURGERY     KYPHOPLASTY N/A 12/25/2020   Procedure: THORACIC TWELVE KYPHOPLASTY;  Surgeon: Beuford Anes, MD;  Location: [facility] OR;  Service: Orthopedics;  Laterality: N/A;    Social History: Social History   Tobacco Use   Smoking status: Never   Smokeless tobacco: Never  Vaping Use   Vaping status: Never Used  Substance Use Topics   Alcohol use: No   Drug use: No    Family History: Family History  Problem  Relation Age of Onset   Heart disease Mother    Hypertension Mother    Heart disease Father    Stroke Father    Hypertension Father     ROS:   Review of Systems  Cardiovascular:  Positive for dyspnea on exertion. Negative for chest pain, claudication, irregular heartbeat, leg swelling, near-syncope, orthopnea, palpitations, paroxysmal nocturnal dyspnea and syncope.  Respiratory:  Negative for shortness of breath.   Hematologic/Lymphatic: Negative for bleeding problem.    EKGs/Labs/Other Studies Reviewed:   EKG: EKG Interpretation Date/Time:  Tuesday September 18 2024 09:21:39 EST Ventricular Rate:  54 PR Interval:  170 QRS Duration:  94 QT Interval:  430 QTC Calculation: 407 R Axis:   -6  Text Interpretation: Sinus bradycardia Low voltage QRS Cannot rule out Anterior infarct , age undetermined When compared with ECG of 23-Dec-2020 08:52, No significant change was found Confirmed by Michele Richardson [phone]) on 09/18/2024 9:49:08 AM  Echocardiogram: 08/09/2024  1. Left ventricular ejection fraction, by estimation, is 50 to 55%. The left ventricle has low normal function. The left ventricle has no regional  wall motion abnormalities. The left ventricular internal cavity size was moderately dilated. Left ventricular diastolic parameters are consistent with Grade II diastolic dysfunction (pseudonormalization). Elevated left atrial pressure.   2. Right ventricular systolic function is normal. The right ventricular size is normal. There is mildly elevated pulmonary artery systolic pressure. The estimated right ventricular systolic pressure is 38.3 mmHg.   3. Left atrial size was mildly dilated.   4. The mitral valve is normal in structure. Trivial mitral valve regurgitation. No evidence of mitral stenosis.   5. The aortic valve is tricuspid. Aortic valve regurgitation is not visualized. Aortic valve sclerosis is present, with no evidence of aortic valve stenosis.   6. The inferior vena cava is  normal in size with greater than 50% respiratory variability, suggesting right atrial pressure of 3 mmHg.   RADIOLOGY: CT abdomen with and without contrast 02/20/2022 VASCULAR   1. Minimal stenosis of the right main renal artery ostium. 2. Mild stenosis of the left main renal artery ostium.   NON-VASCULAR   1. Descending colon diverticulosis.  Labs: External Labs: Collected: July 24, 2024. Total cholesterol 201, triglycerides 182, HDL 72, LDL calculated 98, non-HDL 129 TSH 2.35. A1c 5.7. BUN 19, creatinine 0.96. eGFR 61. Potassium 5. AST, ALT, alkaline phosphatase within normal limits. Hemoglobin 12 g/dL  Physical Exam:    Today's Vitals   09/18/24 0924  BP: (!) 154/85  Pulse: (!) 56  Resp: 16  SpO2: 95%  Weight: 206 lb 9.6 oz (93.7 kg)  Height: 5' 5 (1.651 m)   Body mass index is 34.38 kg/m. Wt Readings from Last 3 Encounters:  09/18/24 206 lb 9.6 oz (93.7 kg)  03/05/24 197 lb 6.4 oz (89.5 kg)  05/13/23 200 lb (90.7 kg)    Physical Exam  Constitutional: No distress.  hemodynamically stable  Neck: No JVD present.  Cardiovascular: Regular rhythm, S1 normal  and S2 normal. Bradycardia present. Exam reveals no gallop, no S3 and no S4.  No murmur heard. Pulmonary/Chest: Effort normal and breath sounds normal. No stridor. She has no wheezes. She has no rales.  Musculoskeletal:        General: No edema.     Cervical back: Neck supple.  Skin: Skin is warm.     Impression & Recommendation(s):  Impression:   ICD-10-CM   1. Exertional dyspnea  R06.09 EKG 12-Lead    Basic metabolic panel with GFR    Pro b natriuretic peptide (BNP)    CT CORONARY MORPH W/CTA COR W/SCORE W/CA W/CM &/OR WO/CM    metoprolol succinate (TOPROL XL) 25 MG 24 hr tablet    irbesartan (AVAPRO) 300 MG tablet    hydrochlorothiazide (HYDRODIURIL) 25 MG tablet    Pro b natriuretic peptide (BNP)    Basic metabolic panel with GFR    2. Benign hypertension  I10     3. OSA on CPAP  G47.33      4. Mixed hyperlipidemia  E78.2        Recommendation(s):  Exertional dyspnea Chronic, now progressive Recent echocardiogram notes low normal LVEF at 50-55%, dilated LV cavity, grade 2 diastolic dysfunction Denies heart failure symptoms. Clinically euvolemic Given her symptoms, dilated LV cavity, low LVEF, and risk factors recommended evaluating for CAD. Patient's heart rate and renal function are well-controlled-she would benefit from a coronary CTA. Coronary CTA to evaluate for CAC, plaque burden, and obstructive disease Blood pressure medication changes as discussed below. If she develops new onset of chest pain or if shortness of breath worsens in intensity frequency and duration patient is advised to seek medical attention by going to the closest ER via EMS for more expedited evaluation.  Patient verbalized understanding  Benign essential hypertension: Currently takes all of her antihypertensive medications in the afternoon Discontinue amlodipine 5 mg p.o. daily Discontinue irbesartan/ hydrochlorothiazide 150/12.5 mg p.o. daily Reduce Toprol-XL from 50 mg p.o. daily to 25 mg p.o. every morning Add Avapro 300 mg p.o. every afternoon Add hydrochlorothiazide 25 mg p.o. every morning NT-proBNP and BMP in 1 week to reevaluate renal function after medication changes Recommend monitoring blood pressures at home. Compliant with CPAP Prior CT of the abdomen noted renal artery stenosis-currently followed by nephrology and PCP  OSA on CPAP Endorses compliance with her CPAP regularly  Mixed hyperlipidemia Continue rosuvastatin   Orders Placed:  Orders Placed This Encounter  Procedures   CT CORONARY MORPH W/CTA COR W/SCORE W/CA W/CM &/OR WO/CM    Standing Status:   Future    Expiration Date:   09/18/2025    Scheduling Instructions:     Please schedule before 10/30/24    If indicated for the ordered procedure, I authorize the administration of contrast media per Radiology  protocol:   Yes    [facility]   Basic metabolic panel with GFR    Standing Status:   Future    Number of Occurrences:   1    Expected Date:   09/25/2024    Expiration Date:   09/18/2025   Pro b natriuretic peptide (BNP)    Standing Status:   Future    Number of Occurrences:   1    Expected Date:   09/25/2024    Expiration Date:   09/18/2025   EKG 12-Lead     Final Medication List:    Meds ordered this encounter  Medications  metoprolol succinate (TOPROL XL) 25 MG 24 hr tablet    Sig: Take 1 tablet (25 mg total) by mouth in the morning.    Dispense:  90 tablet    Refill:  3    Stop 50 mg Toprol Rx   irbesartan (AVAPRO) 300 MG tablet    Sig: Take 1 tablet (300 mg total) by mouth at bedtime.    Dispense:  90 tablet    Refill:  3    D/c irbesartan - hctz   hydrochlorothiazide (HYDRODIURIL) 25 MG tablet    Sig: Take 1 tablet (25 mg total) by mouth in the morning.    Dispense:  90 tablet    Refill:  3    D/c irbesartan - hctz    Medications Discontinued During This Encounter  Medication Reason   albuterol  (VENTOLIN  HFA) 108 (90 Base) MCG/ACT inhaler Patient Preference   famotidine  (PEPCID ) 40 MG tablet Patient Preference   metoprolol succinate (TOPROL-XL) 25 MG 24 hr tablet Dose change   amLODipine (NORVASC) 5 MG tablet Discontinued by provider   metoprolol succinate (TOPROL-XL) 50 MG 24 hr tablet Dose change   irbesartan-hydrochlorothiazide (AVALIDE) 150-12.5 MG tablet Dose change     Current Outpatient Medications:    B Complex Vitamins (B COMPLEX PO), Take by mouth daily., Disp: , Rfl:    CALCIUM PO, Take by mouth., Disp: , Rfl:    hydrochlorothiazide (HYDRODIURIL) 25 MG tablet, Take 1 tablet (25 mg total) by mouth in the morning., Disp: 90 tablet, Rfl: 3   irbesartan (AVAPRO) 300 MG tablet, Take 1 tablet (300 mg total) by mouth at bedtime., Disp: 90 tablet, Rfl: 3   latanoprost (XALATAN) 0.005 % ophthalmic solution, Place 1  drop into both eyes at bedtime., Disp: , Rfl:    levothyroxine  (SYNTHROID , LEVOTHROID) 125 MCG tablet, Take 1 tablet (125 mcg total) by mouth daily., Disp: 90 tablet, Rfl: 1   metoprolol succinate (TOPROL XL) 25 MG 24 hr tablet, Take 1 tablet (25 mg total) by mouth in the morning., Disp: 90 tablet, Rfl: 3   OVER THE COUNTER MEDICATION, Take 1 capsule by mouth daily. (Patient taking differently: Take 1 capsule by mouth daily. Advanced eye health complex), Disp: , Rfl:    pantoprazole  (PROTONIX ) 40 MG tablet, Take 40 mg by mouth daily., Disp: , Rfl:    rosuvastatin (CRESTOR) 20 MG tablet, Take 20 mg by mouth daily. (Patient taking differently: Take 20 mg by mouth once a week.), Disp: , Rfl:   Consent:   N/A  Disposition:   6 weeks sooner if needed  Her questions and concerns were addressed to her satisfaction. She voices understanding of the recommendations provided during this encounter.   Medical Decision Making:  Complexity: high -progressive dyspnea, ischemic workup as outlined above, and also discussed when to consider hospital level of care Interdisciplinary: No Independently reviewed: Labs 07/24/2024 Echo 07/2024 Outside labs provided by PCP noted in media section Prescription drug management: Yes Drug therapy requiring monitoring: Yes  Signed, Madonna Michele HAS, [facility]  A Division of [facility] [address], [place], [place] [address]  09/18/2024 10:58 AM

## 2024-09-18 NOTE — Patient Instructions (Addendum)
 Medication Instructions:   Stop Irbesartan-hydrochlorothiazide Stop taking Amlodipine      Start Irbesartan 300 mg qt bedtime  Start hydrochlorothiazide 25 mg  morning  *If you need a refill on your cardiac medications before your next appointment, please call your pharmacy*   Lab Work: In one week 09/25/24 BMP PRO-BNP If you have labs (blood work) drawn today and your tests are completely normal, you will receive your results only by: MyChart Message (if you have MyChart) OR A paper copy in the mail If you have any lab test that is abnormal or we need to change your treatment, we will call you to review the results.   Testing/Procedures: Your physician has requested that you have coronary  CTA. Coronary computed tomography (CT)angiogram  is a special type of CT scan that uses a computer to produce multi-dimensional views of major blood vessels throughout the heart.  CT angiography, a contrast material is injected through an IV to help visualize the blood vessels  a painless test that uses an x-ray machine to take clear, detailed pictures of your heart arteries .  Please follow instruction sheet as given.    Follow-Up: At [facility], you and your health needs are our priority.  As part of our continuing mission to provide you with exceptional heart care, we have created designated Provider Care Teams.  These Care Teams include your primary Cardiologist (physician) and Advanced Practice Providers (APPs -  Physician Assistants and Nurse Practitioners) who all work together to provide you with the care you need, when you need it.     Your next appointment:   6 week(s)  The format for your next appointment:   In Person  Provider:   Madonna Large, DO   Other Instructions     Your cardiac CT will be scheduled at the below locations:    [facility] [address]  [place], [place] [address]     If scheduled at the [facility]  at [address], please enter the parking lot using the [address] entrance and use the FREE valet service at the patient drop-off area. Enter the building and check-in with registration on the main floor.   Please follow these instructions carefully (unless otherwise directed):  An IV will be required for this test and Nitroglycerin will be given.    On the Night Before the Test: Be sure to Drink plenty of water. Do not consume any caffeinated/decaffeinated beverages or chocolate 12 hours prior to your test. Do not take any antihistamines 12 hours prior to your test.   On the Day of the Test: Drink plenty of water until 1 hour prior to the test. Do not eat any food 1 hour prior to test. You may take your regular medications prior to the test.  Take  your regular dose of metoprolol  two hours prior to test. If you take Hydrochlorothiazide, please HOLD on the morning of the test. Patients who wear a continuous glucose monitor MUST remove the device prior to scanning. FEMALES- please wear underwire-free bra if available, avoid dresses & tight clothing   After the Test: Drink plenty of water. After receiving IV contrast, you may experience a mild flushed feeling. This is normal. On occasion, you may experience a mild rash up to 24 hours after the test. This is not dangerous. If this occurs, you can take Benadryl 25 mg, Zyrtec, Claritin, or Allegra and increase your fluid intake. (Patients taking Tikosyn  should avoid Benadryl, and may take Zyrtec, Claritin, or Allegra) If you experience trouble breathing, this can be serious. If it is severe call 911 IMMEDIATELY. If it is mild, please call our office.  We will call to schedule your test 2-4 weeks out understanding that some insurance companies will need an authorization prior to the service being performed.   For more information and frequently asked questions, please visit our website : [email or web address]  For  non-scheduling related questions, please contact the cardiac imaging nurse navigator should you have any questions/concerns: Cardiac Imaging Nurse Navigators Direct Office Dial: [phone]   For scheduling needs, including cancellations and rescheduling, please call Brittany, [phone].

## 2024-09-25 DIAGNOSIS — R0609 Other forms of dyspnea: Secondary | ICD-10-CM | POA: Diagnosis not present

## 2024-09-26 LAB — BASIC METABOLIC PANEL WITH GFR
BUN/Creatinine Ratio: 19 (ref 12–28)
BUN: 22 mg/dL (ref 8–27)
CO2: 24 mmol/L (ref 20–29)
Calcium: 9.7 mg/dL (ref 8.7–10.3)
Chloride: 99 mmol/L (ref 96–106)
Creatinine, Ser: 1.15 mg/dL — ABNORMAL HIGH (ref 0.57–1.00)
Glucose: 98 mg/dL (ref 70–99)
Potassium: 5 mmol/L (ref 3.5–5.2)
Sodium: 136 mmol/L (ref 134–144)
eGFR: 49 mL/min/1.73 — ABNORMAL LOW (ref >59)

## 2024-09-26 LAB — PRO B NATRIURETIC PEPTIDE: NT-Pro BNP: 232 pg/mL (ref 0–738)

## 2024-09-29 ENCOUNTER — Ambulatory Visit: Payer: Self-pay | Admitting: Cardiology

## 2024-10-02 DIAGNOSIS — H401111 Primary open-angle glaucoma, right eye, mild stage: Secondary | ICD-10-CM | POA: Diagnosis not present

## 2024-10-02 DIAGNOSIS — H401122 Primary open-angle glaucoma, left eye, moderate stage: Secondary | ICD-10-CM | POA: Diagnosis not present

## 2024-10-03 ENCOUNTER — Telehealth: Payer: Self-pay | Admitting: Cardiology

## 2024-10-03 ENCOUNTER — Encounter (HOSPITAL_COMMUNITY): Payer: Self-pay

## 2024-10-03 NOTE — Telephone Encounter (Signed)
 Patient calling with questions concerning CT on Friday. Please advise

## 2024-10-03 NOTE — Telephone Encounter (Signed)
 Pt calling to go through instructions for Cardiac CT. Explained instructions. Verbalizes understanding.

## 2024-10-05 ENCOUNTER — Ambulatory Visit (HOSPITAL_COMMUNITY)
Admission: RE | Admit: 2024-10-05 | Discharge: 2024-10-05 | Disposition: A | Discharge status: Home / Self Care | Source: Ambulatory Visit | Attending: Cardiovascular Disease | Admitting: Cardiovascular Disease

## 2024-10-05 DIAGNOSIS — I2510 Atherosclerotic heart disease of native coronary artery without angina pectoris: Secondary | ICD-10-CM | POA: Diagnosis not present

## 2024-10-05 DIAGNOSIS — R0609 Other forms of dyspnea: Secondary | ICD-10-CM | POA: Insufficient documentation

## 2024-10-05 DIAGNOSIS — R079 Chest pain, unspecified: Secondary | ICD-10-CM | POA: Insufficient documentation

## 2024-10-05 MED ORDER — IOHEXOL 350 MG/ML SOLN
100.0000 mL | Freq: Once | INTRAVENOUS | Status: AC | PRN
  Administered 2024-10-05: 100 mL via INTRAVENOUS

## 2024-10-05 MED ORDER — NITROGLYCERIN 0.4 MG SL SUBL
0.8000 mg | SUBLINGUAL_TABLET | Freq: Once | SUBLINGUAL | Status: AC
  Administered 2024-10-05: 0.8 mg via SUBLINGUAL

## 2024-10-08 ENCOUNTER — Ambulatory Visit: Admitting: Allergy and Immunology

## 2024-10-08 ENCOUNTER — Encounter: Payer: Self-pay | Admitting: Allergy and Immunology

## 2024-10-08 VITALS — BP 150/80 | HR 56 | Temp 98.1°F | Resp 16

## 2024-10-08 DIAGNOSIS — I272 Pulmonary hypertension, unspecified: Secondary | ICD-10-CM

## 2024-10-08 DIAGNOSIS — J4520 Mild intermittent asthma, uncomplicated: Secondary | ICD-10-CM

## 2024-10-08 DIAGNOSIS — K219 Gastro-esophageal reflux disease without esophagitis: Secondary | ICD-10-CM | POA: Diagnosis not present

## 2024-10-08 DIAGNOSIS — Z0000 Encounter for general adult medical examination without abnormal findings: Secondary | ICD-10-CM

## 2024-10-08 DIAGNOSIS — J3089 Other allergic rhinitis: Secondary | ICD-10-CM

## 2024-10-08 DIAGNOSIS — I10 Essential (primary) hypertension: Secondary | ICD-10-CM | POA: Diagnosis not present

## 2024-10-08 MED ORDER — FAMOTIDINE 40 MG PO TABS: ORAL_TABLET | ORAL | 1 refills | Status: AC

## 2024-10-08 NOTE — Patient Instructions (Addendum)
" °  1. Treat and prevent reflux induced airway inflammation:   A. Decrease caffeine consumption  B. Pantoprazole  40 mg - 1 tablet in AM  C. Famotidine  40 mg in PM IF NEEDED  D. Replace throat clearing with swallowing/drinking maneuver  2. If needed:   A. Allegra 180 - 1 tablet 1 time per day  B. Albuterol  - 2 inhalations every 4-6 hours (empty lungs)  3. Check blood - CBC w/d, TSH FT4  4. Return to clinic in 6 months or earlier if problem  5. Influenza = Tamiflu. Covid = Paxlovid "

## 2024-10-08 NOTE — Progress Notes (Unsigned)
 "  [place] - [place] - [place] - [place] - [place]   Follow-up Note  Referring Provider: Cleotilde, [place]  E, PA Primary Provider: Cleotilde Sharyon BRAVO, PA Date of Office Visit: 10/08/2024  Subjective:   [name] (DOB: 1945/11/05) is a 78 y.o. female who [facility] in re-evaluation of the following:  HPI: [name] returns to this clinic in evaluation of asthma, allergic rhinitis, LPR.  Last saw her in this clinic 11 April 2024.  She has had her LPR under pretty good control although she does have some throat clearing and this appears to correlate with not using her famotidine  at nighttime.  She does continue on pantoprazole  every morning.  She is being evaluated for dyspnea on exertion.  She states that she sometimes gets out of breath if she is dressing or walking up stairs or doing any kind of physical activity.  She apparently had an echocardiogram which was okay and she had a coronary morphology CT scan the results of which are not available at this point.  She tried some albuterol  to see if this helps this dyspnea on exertion which it has not.  She has not required a systemic steroid or an antibiotic for any type of airway issue.  She did receive this years flu vaccine.  Allergies as of 10/08/2024       Reactions   Ropinirole Hydrochloride Other (See Comments)   Crazy feeling   Sulfa Antibiotics Other (See Comments)   Sulfacetamide Sodium Other (See Comments)   Bad reaction many years ago - doesn't remember details   Penicillins Rash        Medication List    albuterol  108 (90 Base) MCG/ACT inhaler Commonly known as: VENTOLIN  HFA Inhale into the lungs every 6 (six) hours as needed for wheezing or shortness of breath.   B COMPLEX PO Take by mouth daily.   CALCIUM PO Take by mouth.   hydrochlorothiazide  25 MG tablet Commonly known as: HYDRODIURIL  Take 1 tablet (25 mg total) by mouth in the morning.    irbesartan  300 MG tablet Commonly known as: AVAPRO  Take 1 tablet (300 mg total) by mouth at bedtime.   latanoprost 0.005 % ophthalmic solution Commonly known as: XALATAN Place 1 drop into both eyes at bedtime.   levothyroxine  125 MCG tablet Commonly known as: SYNTHROID  Take 1 tablet (125 mcg total) by mouth daily.   metoprolol  succinate 25 MG 24 hr tablet Commonly known as: Toprol  XL Take 1 tablet (25 mg total) by mouth in the morning.   Nasacort  Allergy  24HR 55 MCG/ACT Aero nasal inhaler Generic drug: triamcinolone  Place 2 sprays into the nose daily as needed.   OVER THE COUNTER MEDICATION Take 1 capsule by mouth daily.   pantoprazole  40 MG tablet Commonly known as: PROTONIX  Take 40 mg by mouth daily.   rosuvastatin 20 MG tablet Commonly known as: CRESTOR Take 20 mg by mouth daily.    Past Medical History:  Diagnosis Date   Anxiety    Asthma    GERD (gastroesophageal reflux disease)    Glaucoma    History of kidney stones    Hyperlipidemia    Hypertension    Macular degeneration    Osteopenia    Sleep apnea    Home test via [facility], Dr. Tammy ordered, wears Cpap nightly   Thyroid  disease     Past Surgical History:  Procedure Laterality Date   APPENDECTOMY     CESAREAN SECTION  FOREARM SURGERY     KYPHOPLASTY N/A 12/25/2020   Procedure: THORACIC TWELVE KYPHOPLASTY;  Surgeon: Beuford Anes, MD;  Location: [facility] OR;  Service: Orthopedics;  Laterality: N/A;    Review of systems negative except as noted in HPI / PMHx or noted below:  Review of Systems  Constitutional: Negative.   HENT: Negative.    Eyes: Negative.   Respiratory: Negative.    Cardiovascular: Negative.   Gastrointestinal: Negative.   Genitourinary: Negative.   Musculoskeletal: Negative.   Skin: Negative.   Neurological: Negative.   Endo/Heme/Allergies: Negative.   Psychiatric/Behavioral: Negative.       Objective:   Vitals:   10/08/24 0957 10/08/24 1030  BP: (!) 162/82 (!)  150/80  Pulse: (!) 56   Resp: 16   Temp: 98.1 F (36.7 C)   SpO2: 95%           Physical Exam Constitutional:      Appearance: She is not diaphoretic.  HENT:     Head: Normocephalic.     Right Ear: Tympanic membrane, ear canal and external ear normal.     Left Ear: Tympanic membrane, ear canal and external ear normal.     Nose: Nose normal. No mucosal edema or rhinorrhea.     Mouth/Throat:     Pharynx: Uvula midline. No oropharyngeal exudate.  Eyes:     Conjunctiva/sclera: Conjunctivae normal.  Neck:     Thyroid : No thyromegaly.     Trachea: Trachea normal. No tracheal tenderness or tracheal deviation.  Cardiovascular:     Rate and Rhythm: Normal rate and regular rhythm.     Heart sounds: Normal heart sounds, S1 normal and S2 normal. No murmur heard. Pulmonary:     Effort: No respiratory distress.     Breath sounds: Normal breath sounds. No stridor. No wheezing or rales.  Lymphadenopathy:     Head:     Right side of head: No tonsillar adenopathy.     Left side of head: No tonsillar adenopathy.     Cervical: No cervical adenopathy.  Skin:    Findings: No erythema or rash.     Nails: There is no clubbing.  Neurological:     Mental Status: She is alert.     Diagnostics: Spirometry was performed and demonstrated an FEV1 of 1.93 at 92 % of predicted.  Results of a echocardiogram obtained 09 August 2024 identified the following:   1. Left ventricular ejection fraction, by estimation, is 50 to 55%. The  left ventricle has low normal function. The left ventricle has no regional  wall motion abnormalities. The left ventricular internal cavity size was  moderately dilated. Left  ventricular diastolic parameters are consistent with Grade II diastolic  dysfunction (pseudonormalization). Elevated left atrial pressure.   2. Right ventricular systolic function is normal. The right ventricular  size is normal. There is mildly elevated pulmonary artery systolic  pressure. The  estimated right ventricular systolic pressure is 38.3 mmHg.   3. Left atrial size was mildly dilated.   4. The mitral valve is normal in structure. Trivial mitral valve  regurgitation. No evidence of mitral stenosis.   5. The aortic valve is tricuspid. Aortic valve regurgitation is not  visualized. Aortic valve sclerosis is present, with no evidence of aortic  valve stenosis.   6. The inferior vena cava is normal in size with greater than 50%  respiratory variability, suggesting right atrial pressure of 3 mmHg.   Results of a coronary artery morphology CT scan obtained 05 October 2024 identified the following:  1. Coronary calcium score of 16.3. This was 31st percentile for age-, sex, and race-matched controls.  2. Normal coronary origin with right dominance. 3. Minimal plaque (<25%) in the LAD and RCA.  4. Dilated pulmonary artery suggestive of pulmonary hypertension.   Assessment and Plan:   1. Asthma, mild intermittent, well-controlled   2. Other allergic rhinitis   3. LPRD (laryngopharyngeal reflux disease)   4. Pulmonary hypertension (HCC)   5. Periodic health assessment, general screening, adult   6. Primary hypertension    1. Treat and prevent reflux induced airway inflammation:   A. Decrease caffeine consumption  B. Pantoprazole  40 mg - 1 tablet in AM  C. Famotidine  40 mg in PM IF NEEDED  D. Replace throat clearing with swallowing/drinking maneuver  2. If needed:   A. Allegra 180 - 1 tablet 1 time per day  B. Albuterol  - 2 inhalations every 4-6 hours (empty lungs)  3. Check blood - CBC w/d, TSH, FT4  4. Return to clinic in 6 months or earlier if problem  5. Influenza = Tamiflu. Covid = Paxlovid  It is not entirely clear why [name] has dyspnea on exertion although there does appear to be some degree of pulmonary hypertension.  Do not think that airflow is an issue here.  Will check her for anemia and thyroid  function.  Her LPR appears to be doing pretty well  though certainly she could use some famotidine  on occasion.  I will contact her with the results of her blood test once they are available for review.  She can follow-up with cardiology concerning further management of her pulmonary hypertension and systemic hypertension.  Camellia Denis, MD Allergy  / Immunology [facility] "

## 2024-10-09 ENCOUNTER — Encounter: Payer: Self-pay | Admitting: Allergy and Immunology

## 2024-10-09 ENCOUNTER — Ambulatory Visit: Payer: Self-pay | Admitting: Allergy and Immunology

## 2024-10-09 LAB — CBC WITH DIFFERENTIAL/PLATELET
Basophils Absolute: 0 x10E3/uL (ref 0.0–0.2)
Basos: 1 %
EOS (ABSOLUTE): 0.2 x10E3/uL (ref 0.0–0.4)
Eos: 3 %
Hematocrit: 40.3 % (ref 34.0–46.6)
Hemoglobin: 12.6 g/dL (ref 11.1–15.9)
Immature Grans (Abs): 0 x10E3/uL (ref 0.0–0.1)
Immature Granulocytes: 0 %
Lymphocytes Absolute: 2 x10E3/uL (ref 0.7–3.1)
Lymphs: 26 %
MCH: 27.5 pg (ref 26.6–33.0)
MCHC: 31.3 g/dL — ABNORMAL LOW (ref 31.5–35.7)
MCV: 88 fL (ref 79–97)
Monocytes Absolute: 0.5 x10E3/uL (ref 0.1–0.9)
Monocytes: 7 %
Neutrophils Absolute: 4.7 x10E3/uL (ref 1.4–7.0)
Neutrophils: 63 %
Platelets: 272 x10E3/uL (ref 150–450)
RBC: 4.59 x10E6/uL (ref 3.77–5.28)
RDW: 13.5 % (ref 11.7–15.4)
WBC: 7.5 x10E3/uL (ref 3.4–10.8)

## 2024-10-09 LAB — TSH+FREE T4
Free T4: 1.55 ng/dL (ref 0.82–1.77)
TSH: 2.48 u[IU]/mL (ref 0.450–4.500)

## 2024-11-02 ENCOUNTER — Ambulatory Visit: Attending: Cardiology | Admitting: Cardiology

## 2024-11-02 ENCOUNTER — Encounter: Payer: Self-pay | Admitting: Cardiology

## 2024-11-02 VITALS — BP 148/80 | HR 63 | Resp 16 | Ht 65.0 in | Wt 211.0 lb

## 2024-11-02 DIAGNOSIS — R0609 Other forms of dyspnea: Secondary | ICD-10-CM

## 2024-11-02 DIAGNOSIS — I10 Essential (primary) hypertension: Secondary | ICD-10-CM | POA: Diagnosis not present

## 2024-11-02 DIAGNOSIS — I700 Atherosclerosis of aorta: Secondary | ICD-10-CM

## 2024-11-02 DIAGNOSIS — R931 Abnormal findings on diagnostic imaging of heart and coronary circulation: Secondary | ICD-10-CM | POA: Diagnosis not present

## 2024-11-02 DIAGNOSIS — G4733 Obstructive sleep apnea (adult) (pediatric): Secondary | ICD-10-CM | POA: Diagnosis not present

## 2024-11-02 DIAGNOSIS — E782 Mixed hyperlipidemia: Secondary | ICD-10-CM | POA: Diagnosis not present

## 2024-11-02 MED ORDER — ROSUVASTATIN CALCIUM 10 MG PO TABS: 10.0000 mg | ORAL_TABLET | Freq: Every day | ORAL | 3 refills | Status: AC

## 2024-11-02 NOTE — Progress Notes (Signed)
 "   Cardiology Office Note:    Date:  11/02/2024  NAME:  [name]    MRN: [id number] DOB:  1945/11/09   PCP:  Cleotilde Tom BRAVO, PA  Former Cardiology Providers: N/A Primary Cardiologist:  Madonna Large, DO, [facility] (established care 09/18/2024) Electrophysiologist:  None   Chief Complaint  Patient presents with   Follow-up    Reevaluate symptoms of shortness of breath and discuss test results    History of Present Illness:    [name] is a 79 y.o. Caucasian female whose past medical history and cardiovascular risk factors includes: Mild coronary artery calcification, aortic atherosclerosis hypertension, obesity, obstructive sleep apnea compliant with CPAP, GERD, hypothyroidism, hyperlipidemia, renal artery stenosis follows with nephrology (per primary documentation).  Patient was referred to practice in December 2020 for and for evaluation of exertional dyspnea and diastolic dysfunction.  During the initial consultation patient was endorsing having shortness of breath with exertion over the last several years but more noticeable with routine activity such as walking dressing and climbing stairs.  She denied orthopnea, PND, chest pain or peripheral edema.  Given her exertional dyspnea and risk factors patient was recommended to undergo coronary CTA results reviewed with her in detail.  In addition I had discontinued amlodipine 5 mg p.o. daily, her irbesartan /hydrochlorothiazide  150/12.5 mg p.o. daily was changed to Avapro  300 mg p.o. every afternoon and hydrochlorothiazide  25 mg p.o. every morning with labs in 1 week.  Her follow-up labs in December 2025 noted renal function and NT-proBNP within normal limits after medication changes.  Patient denies anginal chest pain. She still has shortness of breath with ADLs. Symptoms have not worsened in intensity frequency or duration but noticeable. Home blood pressures recently have also been elevated.  Patient states that she was  undergoing some sinus illness during which time due to having headaches and she was checking her blood pressures and they were as high as 194/90 mmHg.  She did not seek medical attention since the numbers came down to 154 mmHg systolic.  Otherwise she does not check her blood pressures regularly. Her diet is also high in sodium content, canned foods, eating out  She goes to the gym three times a week and walks about a mile and rides a bike but develops dyspnea with these activities. She has no claudication (referral notes mentioned PVD - patient unaware).   Current Medications: Current Meds  Medication Sig   albuterol  (VENTOLIN  HFA) 108 (90 Base) MCG/ACT inhaler Inhale into the lungs every 6 (six) hours as needed for wheezing or shortness of breath.   B Complex Vitamins (B COMPLEX PO) Take by mouth daily.   CALCIUM  PO Take by mouth.   famotidine  (PEPCID ) 40 MG tablet Can take one tablet at bedtime if needed.   hydrochlorothiazide  (HYDRODIURIL ) 25 MG tablet Take 1 tablet (25 mg total) by mouth in the morning.   irbesartan  (AVAPRO ) 300 MG tablet Take 1 tablet (300 mg total) by mouth at bedtime.   latanoprost (XALATAN) 0.005 % ophthalmic solution Place 1 drop into both eyes at bedtime.   levothyroxine  (SYNTHROID , LEVOTHROID) 125 MCG tablet Take 1 tablet (125 mcg total) by mouth daily.   metoprolol  succinate (TOPROL  XL) 25 MG 24 hr tablet Take 1 tablet (25 mg total) by mouth in the morning.   OVER THE COUNTER MEDICATION Take 1 capsule by mouth daily. (Patient taking differently: Take 1 capsule by mouth daily. Advanced eye health complex)   pantoprazole  (PROTONIX ) 40 MG tablet Take  40 mg by mouth daily.   rosuvastatin  (CRESTOR ) 10 MG tablet Take 1 tablet (10 mg total) by mouth daily.   triamcinolone  (NASACORT  ALLERGY  24HR) 55 MCG/ACT AERO nasal inhaler Place 2 sprays into the nose daily as needed.   [DISCONTINUED] rosuvastatin  (CRESTOR ) 20 MG tablet Take 20 mg by mouth daily. (Patient taking  differently: Take 20 mg by mouth once a week.)     Allergies:    Ropinirole hydrochloride, Sulfa antibiotics, Sulfacetamide sodium, and Penicillins   Past Medical History: Past Medical History:  Diagnosis Date   Anxiety    Asthma    GERD (gastroesophageal reflux disease)    Glaucoma    History of kidney stones    Hyperlipidemia    Hypertension    Macular degeneration    Osteopenia    Sleep apnea    Home test via [facility], Dr. Tammy ordered, wears Cpap nightly   Thyroid  disease     Past Surgical History: Past Surgical History:  Procedure Laterality Date   APPENDECTOMY     CESAREAN SECTION     FOREARM SURGERY     KYPHOPLASTY N/A 12/25/2020   Procedure: THORACIC TWELVE KYPHOPLASTY;  Surgeon: Beuford Anes, MD;  Location: [facility] OR;  Service: Orthopedics;  Laterality: N/A;    Social History: Social History   Tobacco Use   Smoking status: Never   Smokeless tobacco: Never  Vaping Use   Vaping status: Never Used  Substance Use Topics   Alcohol use: No   Drug use: No    Family History: Family History  Problem Relation Age of Onset   Heart disease Mother    Hypertension Mother    Heart disease Father    Stroke Father    Hypertension Father     ROS:   Review of Systems  Cardiovascular:  Positive for dyspnea on exertion. Negative for chest pain, claudication, irregular heartbeat, leg swelling, near-syncope, orthopnea, palpitations, paroxysmal nocturnal dyspnea and syncope.  Respiratory:  Negative for shortness of breath.   Hematologic/Lymphatic: Negative for bleeding problem.    EKGs/Labs/Other Studies Reviewed:    Echocardiogram: 08/09/2024  1. Left ventricular ejection fraction, by estimation, is 50 to 55%. The left ventricle has low normal function. The left ventricle has no regional  wall motion abnormalities. The left ventricular internal cavity size was moderately dilated. Left ventricular diastolic parameters are consistent with Grade II diastolic dysfunction  (pseudonormalization). Elevated left atrial pressure.   2. Right ventricular systolic function is normal. The right ventricular size is normal. There is mildly elevated pulmonary artery systolic pressure. The estimated right ventricular systolic pressure is 38.3 mmHg.   3. Left atrial size was mildly dilated.   4. The mitral valve is normal in structure. Trivial mitral valve regurgitation. No evidence of mitral stenosis.   5. The aortic valve is tricuspid. Aortic valve regurgitation is not visualized. Aortic valve sclerosis is present, with no evidence of aortic valve stenosis.   6. The inferior vena cava is normal in size with greater than 50% respiratory variability, suggesting right atrial pressure of 3 mmHg.   CCTA 10/07/2024 1. Coronary calcium  score of 16.3. This was 31st percentile for age-, sex, and race-matched controls.  2. Normal coronary origin with right dominance.  3. Minimal plaque (<25%) in the LAD and RCA.  4. Dilated pulmonary artery suggestive of pulmonary hypertension.  5.  Radiology over read No acute extracardiac findings. Bibasilar subpleural reticulation, left greater than right, likely related to underlying interstitial lung disease. Pneumonia is less likely. Aortic  Atherosclerosis (ICD10-I70.0).  RECOMMENDATIONS: 1. CAD-RADS 1: Minimal non-obstructive CAD (0-24%). Consider non-atherosclerotic causes of chest pain. Consider preventive therapy and risk factor modification.   RADIOLOGY: CT abdomen with and without contrast 02/20/2022 VASCULAR   1. Minimal stenosis of the right main renal artery ostium. 2. Mild stenosis of the left main renal artery ostium.   NON-VASCULAR   1. Descending colon diverticulosis.  Labs: External Labs: Collected: July 24, 2024. Total cholesterol 201, triglycerides 182, HDL 72, LDL calculated 98, non-HDL 129 TSH 2.35. A1c 5.7. BUN 19, creatinine 0.96. eGFR 61. Potassium 5. AST, ALT, alkaline phosphatase within normal  limits. Hemoglobin 12 g/dL  Physical Exam:    Today's Vitals   11/02/24 1014  BP: (!) 148/80  Pulse: 63  Resp: 16  SpO2: 97%  Weight: 211 lb (95.7 kg)  Height: 5' 5 (1.651 m)   Body mass index is 35.11 kg/m. Wt Readings from Last 3 Encounters:  11/02/24 211 lb (95.7 kg)  09/18/24 206 lb 9.6 oz (93.7 kg)  03/05/24 197 lb 6.4 oz (89.5 kg)    Physical Exam  Constitutional: No distress.  hemodynamically stable  Neck: No JVD present.  Cardiovascular: Normal rate, regular rhythm, S1 normal and S2 normal. Exam reveals no gallop, no S3 and no S4.  No murmur heard. Pulmonary/Chest: Effort normal and breath sounds normal. No stridor. She has no wheezes. She has no rales.  Musculoskeletal:        General: No edema.     Cervical back: Neck supple.  Skin: Skin is warm.     Impression & Recommendation(s):  Impression:   ICD-10-CM   1. Agatston coronary artery calcium  score less than 100  R93.1 Comp Met (CMET)    Lipid Profile    2. Aortic atherosclerosis  I70.0     3. Exertional dyspnea  R06.09 Ambulatory referral to Pulmonology    4. Benign hypertension  I10     5. OSA on CPAP  G47.33     6. Mixed hyperlipidemia  E78.2       Recommendation(s):  Coronary artery calcification  Aortic atherosclerosis During the workup of dyspnea patient underwent coronary CTA which noted mild CAC with a total score of 16.3 placing her at the 31st percentile and minimal nonobstructive disease in the LAD and RCA Patient is reassured that her dyspnea is less likely to coronary artery disease. Will hold off on aspirin since her coronary calcium  score is quite low for age She currently takes Crestor  20 mg once a week Recommended taking Crestor  10 mg daily, patient agreeable.  Fasting lipids and CMP in 6 weeks to reevaluate therapy No additional cardiovascular testing warranted at this time  Exertional dyspnea Chronic, now progressive Recent echocardiogram notes low normal LVEF at 50-55%,  dilated LV cavity, grade 2 diastolic dysfunction Coronary CTA: Minimal nonobstructive disease  Clinically euvolemic Recommended better blood pressure management. Radiology over read of the coronary CTA also notes concerns for possible interstitial lung disease.  I will refer her to pulmonary medicine for further evaluation.  Once her blood pressures are better controlled and initial workup is completed from pulmonary medicine I suspect at one point she will need a right heart catheterization to better evaluate her hemodynamics  Benign essential hypertension: Office blood pressure not at goal She does not check her blood pressures at home regularly only when she is sick Continue Avapro  300 mg p.o. every afternoon Continue hydrochlorothiazide  25 mg p.o. every morning Continue metoprolol  25 mg p.o. every morning Start Imdur  30 mg p.o. every morning Patient states that she will follow-up with PCP for further blood pressure medication titration after checking her readings at home Recommended goal SBP 130 mmHg We emphasized importance of low-salt diet Prior CT of the abdomen noted renal artery stenosis-currently followed by nephrology and PCP  OSA on CPAP Endorses compliance with her CPAP regularly  Mixed hyperlipidemia Continue rosuvastatin    Orders Placed:  Orders Placed This Encounter  Procedures   Comp Met (CMET)   Lipid Profile   Ambulatory referral to Pulmonology    Referral Priority:   Routine    Referral Type:   Consultation    Referral Reason:   Specialty Services Required    Referred to Provider:   Geronimo Amel, MD    Requested Specialty:   Pulmonary Disease    Number of Visits Requested:   1     Final Medication List:    Meds ordered this encounter  Medications   rosuvastatin  (CRESTOR ) 10 MG tablet    Sig: Take 1 tablet (10 mg total) by mouth daily.    Dispense:  90 tablet    Refill:  3    Medications Discontinued During This Encounter  Medication Reason    rosuvastatin  (CRESTOR ) 20 MG tablet Dose change      Current Outpatient Medications:    albuterol  (VENTOLIN  HFA) 108 (90 Base) MCG/ACT inhaler, Inhale into the lungs every 6 (six) hours as needed for wheezing or shortness of breath., Disp: , Rfl:    B Complex Vitamins (B COMPLEX PO), Take by mouth daily., Disp: , Rfl:    CALCIUM  PO, Take by mouth., Disp: , Rfl:    famotidine  (PEPCID ) 40 MG tablet, Can take one tablet at bedtime if needed., Disp: 90 tablet, Rfl: 1   hydrochlorothiazide  (HYDRODIURIL ) 25 MG tablet, Take 1 tablet (25 mg total) by mouth in the morning., Disp: 90 tablet, Rfl: 3   irbesartan  (AVAPRO ) 300 MG tablet, Take 1 tablet (300 mg total) by mouth at bedtime., Disp: 90 tablet, Rfl: 3   latanoprost (XALATAN) 0.005 % ophthalmic solution, Place 1 drop into both eyes at bedtime., Disp: , Rfl:    levothyroxine  (SYNTHROID , LEVOTHROID) 125 MCG tablet, Take 1 tablet (125 mcg total) by mouth daily., Disp: 90 tablet, Rfl: 1   metoprolol  succinate (TOPROL  XL) 25 MG 24 hr tablet, Take 1 tablet (25 mg total) by mouth in the morning., Disp: 90 tablet, Rfl: 3   OVER THE COUNTER MEDICATION, Take 1 capsule by mouth daily. (Patient taking differently: Take 1 capsule by mouth daily. Advanced eye health complex), Disp: , Rfl:    pantoprazole  (PROTONIX ) 40 MG tablet, Take 40 mg by mouth daily., Disp: , Rfl:    rosuvastatin  (CRESTOR ) 10 MG tablet, Take 1 tablet (10 mg total) by mouth daily., Disp: 90 tablet, Rfl: 3   triamcinolone  (NASACORT  ALLERGY  24HR) 55 MCG/ACT AERO nasal inhaler, Place 2 sprays into the nose daily as needed., Disp: , Rfl:   Consent:   N/A  Disposition:   6 months sooner if needed  Her questions and concerns were addressed to her satisfaction. She voices understanding of the recommendations provided during this encounter.   Medical Decision Making:  Discussed management of at least two chronic comorbid conditions.  I have independently interpreted results of the CCTA   Prescription drug management including but not limited to: addition/titration/discontinuation of medical therapy, medication reconciliation, discussed potential side effects, follow up labs ordered for monitoring safety.   Labs ordered - see above.  Referrals placed - see above.  I have independently formulated and discussed the assessment and plan with the patient. This note will be shared with the patient's primary care provider to coordinate care.    Signed, Madonna Michele HAS, [facility]  A Division of [facility] [address], [place], [place] [address]  11/02/2024 1:07 PM  "

## 2024-11-02 NOTE — Patient Instructions (Signed)
 Medication Instructions:  START Rosuvastatin  (Crestor ) 10  mg. Take one (1) tablet by mouth once daily.  *If you need a refill on your cardiac medications before your next appointment, please call your pharmacy*  Lab Work: Fasting Lipid and CMP in 6 weeks If you have labs (blood work) drawn today and your tests are completely normal, you will receive your results only by: MyChart Message (if you have MyChart) OR A paper copy in the mail If you have any lab test that is abnormal or we need to change your treatment, we will call you to review the results.  Testing/Procedures: None ordered  Follow-Up: At [facility], you and your health needs are our priority.  As part of our continuing mission to provide you with exceptional heart care, our providers are all part of one team.  This team includes your primary Cardiologist (physician) and Advanced Practice Providers or APPs (Physician Assistants and Nurse Practitioners) who all work together to provide you with the care you need, when you need it.  Your next appointment:   6 month(s)  Provider:   Madonna Large, DO    We recommend signing up for the patient portal called MyChart.  Sign up information is provided on this After Visit Summary.  MyChart is used to connect with patients for Virtual Visits (Telemedicine).  Patients are able to view lab/test results, encounter notes, upcoming appointments, etc.  Non-urgent messages can be sent to your provider as well.   To learn more about what you can do with MyChart, go to [email or web address].   Other Instructions  We are referring you to Pulmonology for evaluation of ILD, SOB, and an Abnormal CT.

## 2024-11-14 NOTE — Telephone Encounter (Signed)
 Spoke with patient. Relayed Dr. Tyree note. Patient expressed that BP readings are still high and did not trust her cuff. Recommended ordering [employer] Series 3 and advised patient on how to obtain an accurate measurement. Will place order with approval from Dr. Michele. All other concerns addressed.

## 2024-11-14 NOTE — Telephone Encounter (Signed)
-----   Message from [place], [place] sent at 10/15/2024  4:48 PM EST ----- [name] Mild coronary calcification, with a total score of 16.3, 31st percentile. Minimal nonobstructive CAD.  We will review the results at the next office visit. Call us  sooner if questions or concerns arise.  Regards,   Sunit Laclede, DO, FACC

## 2024-11-22 ENCOUNTER — Other Ambulatory Visit (HOSPITAL_COMMUNITY): Payer: Self-pay

## 2024-11-22 MED ORDER — OMRON 3 SERIES BP MONITOR DEVI
1.0000 | Freq: Every day | 0 refills | Status: AC
  Filled 2024-11-22: qty 1, 30d supply, fill #0

## 2024-11-22 NOTE — Addendum Note (Signed)
 Addended by: CECELIA MADURA R on: 11/22/2024 10:47 AM   Modules accepted: Orders

## 2025-04-08 ENCOUNTER — Ambulatory Visit: Admitting: Allergy and Immunology
